# Patient Record
Sex: Female | Born: 1950
Health system: Southern US, Community
[De-identification: ages and names within clinical notes are randomized; demographics above are authoritative.]

## PROBLEM LIST (undated history)

## (undated) DIAGNOSIS — B019 Varicella without complication: Secondary | ICD-10-CM

## (undated) DIAGNOSIS — G43909 Migraine, unspecified, not intractable, without status migrainosus: Secondary | ICD-10-CM

## (undated) HISTORY — PX: FINGER SURGERY: SHX640

## (undated) HISTORY — DX: Varicella without complication: B01.9

## (undated) HISTORY — PX: BREAST BIOPSY: SHX20

## (undated) HISTORY — DX: Migraine, unspecified, not intractable, without status migrainosus: G43.909

---

## 1989-08-08 HISTORY — PX: BREAST EXCISIONAL BIOPSY: SUR124

## 1998-01-13 ENCOUNTER — Ambulatory Visit (HOSPITAL_COMMUNITY): Admission: RE | Admit: 1998-01-13 | Discharge: 1998-01-13 | Payer: Self-pay | Admitting: Obstetrics & Gynecology

## 1998-09-16 ENCOUNTER — Other Ambulatory Visit: Admission: RE | Admit: 1998-09-16 | Discharge: 1998-09-16 | Payer: Self-pay | Admitting: Obstetrics & Gynecology

## 1998-12-14 ENCOUNTER — Other Ambulatory Visit: Admission: RE | Admit: 1998-12-14 | Discharge: 1998-12-14 | Payer: Self-pay | Admitting: Obstetrics & Gynecology

## 1999-02-16 ENCOUNTER — Encounter: Payer: Self-pay | Admitting: Obstetrics & Gynecology

## 1999-02-16 ENCOUNTER — Ambulatory Visit (HOSPITAL_COMMUNITY): Admission: RE | Admit: 1999-02-16 | Discharge: 1999-02-16 | Payer: Self-pay | Admitting: Obstetrics & Gynecology

## 1999-09-20 ENCOUNTER — Other Ambulatory Visit: Admission: RE | Admit: 1999-09-20 | Discharge: 1999-09-20 | Payer: Self-pay | Admitting: Obstetrics & Gynecology

## 2000-02-22 ENCOUNTER — Ambulatory Visit (HOSPITAL_COMMUNITY): Admission: RE | Admit: 2000-02-22 | Discharge: 2000-02-22 | Payer: Self-pay | Admitting: Obstetrics & Gynecology

## 2000-02-22 ENCOUNTER — Encounter: Payer: Self-pay | Admitting: Obstetrics & Gynecology

## 2000-10-23 ENCOUNTER — Other Ambulatory Visit: Admission: RE | Admit: 2000-10-23 | Discharge: 2000-10-23 | Payer: Self-pay | Admitting: Obstetrics & Gynecology

## 2001-05-02 ENCOUNTER — Ambulatory Visit (HOSPITAL_COMMUNITY): Admission: RE | Admit: 2001-05-02 | Discharge: 2001-05-02 | Payer: Self-pay | Admitting: Obstetrics & Gynecology

## 2001-05-02 ENCOUNTER — Encounter: Payer: Self-pay | Admitting: Obstetrics & Gynecology

## 2001-06-13 ENCOUNTER — Encounter: Payer: Self-pay | Admitting: Internal Medicine

## 2001-06-20 ENCOUNTER — Encounter: Payer: Self-pay | Admitting: Internal Medicine

## 2001-10-31 ENCOUNTER — Other Ambulatory Visit: Admission: RE | Admit: 2001-10-31 | Discharge: 2001-10-31 | Payer: Self-pay | Admitting: Obstetrics & Gynecology

## 2002-05-09 ENCOUNTER — Encounter: Payer: Self-pay | Admitting: Obstetrics & Gynecology

## 2002-05-09 ENCOUNTER — Ambulatory Visit (HOSPITAL_COMMUNITY): Admission: RE | Admit: 2002-05-09 | Discharge: 2002-05-09 | Payer: Self-pay | Admitting: Obstetrics & Gynecology

## 2002-11-06 ENCOUNTER — Other Ambulatory Visit: Admission: RE | Admit: 2002-11-06 | Discharge: 2002-11-06 | Payer: Self-pay | Admitting: Obstetrics & Gynecology

## 2003-06-02 ENCOUNTER — Encounter: Payer: Self-pay | Admitting: Obstetrics & Gynecology

## 2003-06-02 ENCOUNTER — Ambulatory Visit (HOSPITAL_COMMUNITY): Admission: RE | Admit: 2003-06-02 | Discharge: 2003-06-02 | Payer: Self-pay | Admitting: Obstetrics & Gynecology

## 2003-11-26 ENCOUNTER — Other Ambulatory Visit: Admission: RE | Admit: 2003-11-26 | Discharge: 2003-11-26 | Payer: Self-pay | Admitting: Obstetrics & Gynecology

## 2004-06-09 ENCOUNTER — Ambulatory Visit (HOSPITAL_COMMUNITY): Admission: RE | Admit: 2004-06-09 | Discharge: 2004-06-09 | Payer: Self-pay | Admitting: Obstetrics & Gynecology

## 2004-12-06 ENCOUNTER — Other Ambulatory Visit: Admission: RE | Admit: 2004-12-06 | Discharge: 2004-12-06 | Payer: Self-pay | Admitting: Obstetrics & Gynecology

## 2005-07-07 ENCOUNTER — Ambulatory Visit (HOSPITAL_COMMUNITY): Admission: RE | Admit: 2005-07-07 | Discharge: 2005-07-07 | Payer: Self-pay | Admitting: Obstetrics & Gynecology

## 2006-07-11 ENCOUNTER — Ambulatory Visit (HOSPITAL_COMMUNITY): Admission: RE | Admit: 2006-07-11 | Discharge: 2006-07-11 | Payer: Self-pay | Admitting: Obstetrics & Gynecology

## 2006-07-19 ENCOUNTER — Encounter: Admission: RE | Admit: 2006-07-19 | Discharge: 2006-07-19 | Payer: Self-pay | Admitting: Obstetrics & Gynecology

## 2007-08-07 ENCOUNTER — Ambulatory Visit (HOSPITAL_COMMUNITY): Admission: RE | Admit: 2007-08-07 | Discharge: 2007-08-07 | Payer: Self-pay | Admitting: Obstetrics & Gynecology

## 2008-08-13 ENCOUNTER — Ambulatory Visit (HOSPITAL_COMMUNITY): Admission: RE | Admit: 2008-08-13 | Discharge: 2008-08-13 | Payer: Self-pay | Admitting: Obstetrics & Gynecology

## 2008-08-19 ENCOUNTER — Encounter: Admission: RE | Admit: 2008-08-19 | Discharge: 2008-08-19 | Payer: Self-pay | Admitting: Obstetrics & Gynecology

## 2009-08-20 ENCOUNTER — Ambulatory Visit (HOSPITAL_COMMUNITY): Admission: RE | Admit: 2009-08-20 | Discharge: 2009-08-20 | Payer: Self-pay | Admitting: Obstetrics & Gynecology

## 2010-06-08 ENCOUNTER — Encounter (INDEPENDENT_AMBULATORY_CARE_PROVIDER_SITE_OTHER): Payer: Self-pay | Admitting: *Deleted

## 2010-06-14 ENCOUNTER — Encounter: Admission: RE | Admit: 2010-06-14 | Discharge: 2010-06-14 | Payer: Self-pay | Admitting: Family Medicine

## 2010-07-12 ENCOUNTER — Telehealth: Payer: Self-pay | Admitting: Internal Medicine

## 2010-07-13 DIAGNOSIS — R197 Diarrhea, unspecified: Secondary | ICD-10-CM | POA: Insufficient documentation

## 2010-07-13 DIAGNOSIS — E049 Nontoxic goiter, unspecified: Secondary | ICD-10-CM | POA: Insufficient documentation

## 2010-07-13 DIAGNOSIS — K589 Irritable bowel syndrome without diarrhea: Secondary | ICD-10-CM | POA: Insufficient documentation

## 2010-07-19 ENCOUNTER — Ambulatory Visit: Payer: Self-pay | Admitting: Internal Medicine

## 2010-07-19 ENCOUNTER — Encounter: Payer: Self-pay | Admitting: Internal Medicine

## 2010-07-19 DIAGNOSIS — R1013 Epigastric pain: Secondary | ICD-10-CM

## 2010-07-19 DIAGNOSIS — K3189 Other diseases of stomach and duodenum: Secondary | ICD-10-CM | POA: Insufficient documentation

## 2010-07-20 ENCOUNTER — Telehealth (INDEPENDENT_AMBULATORY_CARE_PROVIDER_SITE_OTHER): Payer: Self-pay | Admitting: *Deleted

## 2010-07-20 LAB — CONVERTED CEMR LAB
Albumin: 3.7 g/dL (ref 3.5–5.2)
Alkaline Phosphatase: 71 units/L (ref 39–117)
IgA: 139 mg/dL (ref 68–378)
Lipase: 32 units/L (ref 11.0–59.0)
Sed Rate: 13 mm/hr (ref 0–22)
Total Protein: 6.6 g/dL (ref 6.0–8.3)

## 2010-07-21 ENCOUNTER — Telehealth: Payer: Self-pay | Admitting: Internal Medicine

## 2010-08-25 ENCOUNTER — Ambulatory Visit (HOSPITAL_COMMUNITY)
Admission: RE | Admit: 2010-08-25 | Discharge: 2010-08-25 | Payer: Self-pay | Source: Home / Self Care | Attending: Obstetrics & Gynecology | Admitting: Obstetrics & Gynecology

## 2010-08-29 ENCOUNTER — Encounter: Payer: Self-pay | Admitting: Internal Medicine

## 2010-08-29 ENCOUNTER — Encounter: Payer: Self-pay | Admitting: Obstetrics & Gynecology

## 2010-09-08 ENCOUNTER — Encounter: Payer: Self-pay | Admitting: Internal Medicine

## 2010-09-09 NOTE — Progress Notes (Signed)
  Phone Note Call from Patient Call back at Home Phone [phone]   Caller: Patient Call For: nurse Summary of Call: Patient is concerned because she is scheduled for a HIDA scan on 07/26/10 and  she remembers Dr. Juanda Chance saying she had a recent ultrasound of abdomen/gallbladder. Patient states she has not had a recent ultrasound of the abdomen. She wants to be sure she doesn't need to have an ultrasound prior to the HIDA scan. Please, advise. Initial call taken by: Jesse Fall RN,  July 20, 2010 4:46 PM  Follow-up for Phone Call        I have checked the chart and she had a abd. ultrasound in 2002, , so she ought to have  an abdominal ultrasound before the HIDA scan. Sorry, I thought I saw in her outside records where she had a sono recently. can You, please, schedule abd. sono before her HIDA? Thank You Follow-up by: Hart Carwin MD,  July 20, 2010 10:31 PM  Additional Follow-up for Phone Call Additional follow up Details #1::        Scheduled patient for Abdominal ultrasound on 07/22/10 at [facility] radiology(Jennifer). Patient to be NPO 4 hours prior to ultrasound.Message left for patient to call back at her home number. Jesse Fall RN  July 21, 2010 8:42 AM Patient given above information.Patient was worried about precert/insurance with the ultrasound. Morrie Sheldon given order to precert and she states she will call patient with information as soon as she has it. Patient also wanted to cancel HIDA scan but did agree it leaving it on until ultrasound results are back. Additional Follow-up by: Jesse Fall RN,  July 21, 2010 10:55 AM

## 2010-09-09 NOTE — Assessment & Plan Note (Signed)
Summary: chronic diarrhea 2 PM appointment/[facility]   History of Present Illness Visit Type: Initial Visit Primary GI MD: Lina Sar MD Primary Provider: Porfirio Oar, PA Chief Complaint: diarrhea, that subsuded early Nov., weight loss & nausea History of Present Illness:   This is a 60 year old white female who has had 3 separate episodes of nausea followed by diarrhea and indigestion lasting several days to 2 weeks. Her first episode occurred in August 2011. The second episode in September and a third episode in October. This was evaluated at [facility]. She had an upper abdominal ultrasound which was normal. She denies symptoms of gastroesophageal reflux. During the episodes, patient is unable to eat and consequently has lost weight. She is down to 100 pounds from an initial 108 pounds. She has no prior GI history. There is no family history of inflammatory bowel disease but there is a strong history of gallbladder disease in both parents. Her father had a cholecystectomy when he was about 82 years old and her mother had a cholecystectomy at age 42.   GI Review of Systems    Reports bloating, nausea, vomiting, and  weight loss.      Denies abdominal pain, acid reflux, belching, chest pain, dysphagia with liquids, dysphagia with solids, heartburn, loss of appetite, vomiting blood, and  weight gain.      Reports constipation and  diarrhea.     Denies anal fissure, black tarry stools, change in bowel habit, diverticulosis, fecal incontinence, heme positive stool, hemorrhoids, irritable bowel syndrome, jaundice, light color stool, liver problems, rectal bleeding, and  rectal pain. Preventive Screening-Counseling & Management  Alcohol-Tobacco     Smoking Status: never    Current Medications (verified): 1)  Terbinafine Hcl 250 Mg Tabs (Terbinafine Hcl) .... Once Daily 2)  Keppra 250 Mg Tabs (Levetiracetam) .... Take 1 At Bedtime 3)  Imitrex 100 Mg Tabs (Sumatriptan  Succinate) .... As Needed 4)  Zomig 5 Mg Tabs (Zolmitriptan) .... As Needed 5)  Naproxen Sodium 550 Mg Tabs (Naproxen Sodium) .... As Needed  Allergies (verified): 1)  ! Pcn  Past History:  Past Medical History: Chronic Headache ITABLE BOWEL SYNDROME (ICD-564.1) THYROMEGALY (ICD-240.9) DIARRHEA, CHRONIC (ICD-787.91)   Chronic Headaches Depression  Past Surgical History: Reviewed history from 07/13/2010 and no changes required. Breast Biopsy Toenail Removal  Family History: Gallbladder Disease-Mother, Father No FH of Colon Cancer: Family History of Breast Cancer: Family History of Colon Cancer:Uncle Family History of Colon Polyps:  Social History: Alcohol Use - no Illicit Drug Use - no Occupation: Psychologist, sport and exercise Patient has never smoked.  Smoking Status:  never  Review of Systems       The patient complains of headaches-new and sleeping problems.  The patient denies allergy/sinus, anemia, anxiety-new, arthritis/joint pain, back pain, blood in urine, breast changes/lumps, change in vision, confusion, cough, coughing up blood, depression-new, fainting, fatigue, fever, hearing problems, heart murmur, heart rhythm changes, itching, menstrual pain, muscle pains/cramps, night sweats, nosebleeds, pregnancy symptoms, shortness of breath, skin rash, sore throat, swelling of feet/legs, swollen lymph glands, thirst - excessive , urination - excessive , urination changes/pain, urine leakage, vision changes, and voice change.         Pertinent positive and negative review of systems were noted in the above HPI. All other ROS was otherwise negative.   Vital Signs:  Patient profile:   60 year old female Height:      63 inches Weight:      100 pounds BMI:  17.78 Pulse rate:   64 / minute Pulse rhythm:   regular BP sitting:   100 / 62  (left arm) Cuff size:   regular  Vitals Entered By: June McMurray CMA Duncan Dull) (July 19, 2010 2:27 PM)  Physical Exam  General:  Alert,  oriented and cooperative. Eyes:  Nonicteric. Mouth:  Normal mucosa. Neck:  Supple; no masses or thyromegaly. Lungs:  Clear throughout to auscultation. Heart:  Regular rate and rhythm; no murmurs, rubs,  or bruits. Abdomen:  soft relaxed abdomen with normoactive bowel sounds. Tender on inspiration along the right costal margin. Area above the gallbladder is tender. Right lower quadrant unremarkable without mass. Left lower and upper quadrants are normal. Rectal:  normal with soft Hemoccult-negative stool in large amount. Extremities:  No clubbing, cyanosis, edema or deformities noted. Skin:  Intact without significant lesions or rashes. Psych:  Alert and cooperative. Normal mood and affect.   Impression & Recommendations:  Problem # 1:  DIARRHEA, CHRONIC (ICD-787.91) Patient has had several episode of diarrhea and dyspepsia. She has had long-standing food intolerance and has a strong family history of gallbladder disease. I suspect she has biliary dysfunction. We will obtain liver function tests, amylase and lipase. We will schedule her for a HIDA scan with CCK. Orders: TLB-Hepatic/Liver Function Pnl (80076-HEPATIC) TLB-Amylase (82150-AMYL) TLB-Lipase (83690-LIPASE) TLB-Sedimentation Rate (ESR) (85652-ESR) TLB-IgA (Immunoglobulin A) (82784-IGA) T-Tissue Transglutamase Ab IgA (16109-60454) HIDA CCK (HIDA CCK)  Problem # 2:  IRRITABLE BOWEL SYNDROME (ICD-564.1) Patient has constipation predominant IBS with episodes of acute diarrhea. She will need a colonoscopy. But, before that, we will go ahead and evaluate her for gallbladder disease. We will check tissue transglutaminase levels, sedimentation rate and IgA levels. She is to follow a low-fat diet.  Problem # 3:  DYSPEPSIA (ICD-536.8)  Patient will need an upper endoscopy if HIDA scan negative. She could,  at that time have screening colonoscopy  Orders: HIDA CCK (HIDA CCK)  Patient Instructions: 1)  Your physician requests that  you go to the basement floor of our office to have the following labwork completed before leaving today: LFT's, amylase, lipase, TtG, IgA, Sedimentation rate. 2)  You have been scheduled for a HIDA scan with [facility] @ [facility] on 07/26/10 @ 1:00 pm. 3)  continue low-fat diet 4)  If HIDA scan negative consider upper endoscopy and colonoscopy 5)  Copy sent to : Porfirio Oar, PA 6)  The medication list was reviewed and reconciled.  All changed / newly prescribed medications were explained.  A complete medication list was provided to the patient / caregiver.

## 2010-09-09 NOTE — Assessment & Plan Note (Signed)
Summary: Gastroenterology    [facility] PATIENT GASTROENTEROLOGY ADMISSION NOTE   NAME:  Patricia Chapman, Patricia Chapman  DATE:  06/13/2001       OFFICE NO:  [phone]  The patient is a very nice 60 year old black female referred by Dr. Jennette Kettle for evaluation of bloating and tenderness under the ribs. She dates the onset of these symptoms several months ago, having intermittent diarrhea and constipation. Despite of having good eating habits, she has progressed to food intolerance to where she prefers to eat at home. She has been concerned about symptoms of ovarian cancer being bloating, and she wanted to be checked out. Her weight has been stable. She denies any rectal bleeding. There is no family history of colon cancer, but there is strong family history of gallbladder disease in both her mother and father. She takes aspirin on an as-needed basis, does not drink alcohol. She also takes Aleve on an as-needed basis. She denies symptoms of gastroesophageal reflux.  PHYSICAL EXAMINATION: Blood pressure is 118/60. The pulse is 78. The weight is 98 pounds. She was thin, in no distress. Skin was warm and vascular. Sclerae nonicteric. Neck was supple with no adenopathy. The lungs were clear to auscultation. COR: Normal S1, normal S2. The abdominal exam showed thin, relaxed abdomen. Mild tenderness in epigastric and subxiphoid area in the midline. Gallbladder area and right upper quadrant were unremarkable. There was no costovertebral angle tenderness. Lower abdomen showed palpable colon but no mass or stool. Rectal exam showed normal rectal tone with hemoccult-negative stool.  IMPRESSION 57.  A 60 year old female with nonspecific bloating and food intolerance with positive family history of gallbladder disease, rule out biliary dysfunction. 2.  A positive family history of colon polyps but no family history of colon cancer. Suspect irritable bowel syndrome. Doubt inflammatory bowel disease.  PLAN 1.  Ultrasound  of the gallbladder and upper abdomen. 2.  Citrucel 1 pack a day. 3.  Increase fiber. Fiber diet given to the patient. 4.  Discuss colonoscopy with the patient. She has [employer] who does not cover screening colonoscopy. The patient is going to wait to see whether her symptoms subside before scheduling a colonoscopy.     Hedwig Morton. Juanda Chance, M.D.  [id number] cc:     Dr. Varney Baas D:   06/13/2001; T: ; Job [id number]

## 2010-09-09 NOTE — Progress Notes (Signed)
Summary: Triage  Phone Note Call from Patient Call back at Home Phone [phone]   Caller: Patient Call For: Dr. Juanda Chance Reason for Call: Talk to Nurse Summary of Call: Pt was having chronic diarrhea, was put on jacobs schedule and is a brodie pt, wants todiscuss diarrhea and colon with brodie but no available appts?!?! wants to see someone Initial call taken by: [place] Johnson,  July 12, 2010 3:30 PM  Follow-up for Phone Call         Patient added to Dr. Regino Schultze schedule on 07/14/10 at 9 AM. Message left for patient to call back.Jesse Fall RN  July 12, 2010 3:45 Message left for patient to call back.Jesse Fall RN  July 13, 2010 8:08 AM Spoke with patient. She is having a mirgaine and wants to r/s for 07/19/10 at 2:00 PM. Patient rescheduled. Follow-up by: Jesse Fall RN,  July 13, 2010 10:04 AM

## 2010-09-09 NOTE — Progress Notes (Signed)
Summary: Questions  Phone Note Call from Patient Call back at Work Phone [phone]   Caller: Patient Call For: Dr. Juanda Chance Reason for Call: Talk to Nurse Summary of Call: Pt wants to speak with Rene Kocher again following up from this morning Initial call taken by: [place] Johnson,  July 21, 2010 4:04 PM  Follow-up for Phone Call        Patient has decided she wants to wait until the first of the year to get the ultrasound and HIDA scan. Patient given the number to call radiology to reschedule these appointments to a more suitable time for her. Follow-up by: Jesse Fall RN,  July 21, 2010 4:38 PM  Additional Follow-up for Phone Call Additional follow up Details #1::        OK Additional Follow-up by: Hart Carwin MD,  July 21, 2010 11:24 PM     Appended Document: Questions I have received a message from patient (I called her to discuss whether she planned on going through with HIDA and u/s). Patient states that she is actually doing much better and would like to hold off on the tests until she has more symptoms. At this time, all tests have been cancelled.

## 2010-09-09 NOTE — Letter (Signed)
Summary: New Patient letter  [facility]  [address] [place], [place] [address]   Phone: [phone]  Fax: [phone]       06/08/2010 MRN: [id number]  Patricia Chapman [address] [place], [place]  [address]  Dear Patricia Chapman,  Welcome to the Gastroenterology Division at [facility]    You are scheduled to see Dr.  Christella Chapman on 07-16-10 at 9:15a.m. on the 3rd floor at [facility], [address]  We ask that you try to arrive at our office 15 minutes prior to your appointment time to allow for check-in.  We would like you to complete the enclosed self-administered evaluation form prior to your visit and bring it with you on the day of your appointment.  We will review it with you.  Also, please bring a complete list of all your medications or, if you prefer, bring the medication bottles and we will list them.  Please bring your insurance card so that we may make a copy of it.  If your insurance requires a referral to see a specialist, please bring your referral form from your primary care physician.  Co-payments are due at the time of your visit and may be paid by cash, check or credit card.     Your office visit will consist of a consult with your physician (includes a physical exam), any laboratory testing he/she may order, scheduling of any necessary diagnostic testing (e.g. x-ray, ultrasound, CT-scan), and scheduling of a procedure (e.g. Endoscopy, Colonoscopy) if required.  Please allow enough time on your schedule to allow for any/all of these possibilities.    If you cannot keep your appointment, please call [phone] to cancel or reschedule prior to your appointment date.  This allows [place] the opportunity to schedule an appointment for another patient in need of care.  If you do not cancel or reschedule by 5 p.m. the business day prior to your appointment date, you will be charged a $50.00 late cancellation/no-show fee.    Thank you for choosing [facility] for your medical needs.  We appreciate the opportunity to care for you.  Please visit [place] at our website  to learn more about our practice.                     Sincerely,                                                             The Gastroenterology Division

## 2011-07-18 ENCOUNTER — Other Ambulatory Visit (HOSPITAL_COMMUNITY): Payer: Self-pay | Admitting: Obstetrics & Gynecology

## 2011-07-18 DIAGNOSIS — Z1231 Encounter for screening mammogram for malignant neoplasm of breast: Secondary | ICD-10-CM

## 2011-08-29 ENCOUNTER — Ambulatory Visit (HOSPITAL_COMMUNITY)
Admission: RE | Admit: 2011-08-29 | Discharge: 2011-08-29 | Disposition: A | Discharge status: Home / Self Care | Payer: BC Managed Care – PPO | Source: Ambulatory Visit | Attending: Obstetrics & Gynecology | Admitting: Obstetrics & Gynecology

## 2011-08-29 DIAGNOSIS — Z1231 Encounter for screening mammogram for malignant neoplasm of breast: Secondary | ICD-10-CM | POA: Insufficient documentation

## 2011-10-20 ENCOUNTER — Encounter: Payer: Self-pay | Admitting: Physician Assistant

## 2011-10-20 ENCOUNTER — Ambulatory Visit (INDEPENDENT_AMBULATORY_CARE_PROVIDER_SITE_OTHER): Payer: BC Managed Care – PPO | Admitting: Family Medicine

## 2011-10-20 VITALS — BP 102/60 | HR 65 | Temp 97.8°F | Resp 16 | Ht 64.5 in | Wt 100.8 lb

## 2011-10-20 DIAGNOSIS — R209 Unspecified disturbances of skin sensation: Secondary | ICD-10-CM

## 2011-10-20 DIAGNOSIS — R202 Paresthesia of skin: Secondary | ICD-10-CM

## 2011-10-20 NOTE — Patient Instructions (Signed)
I'll let you know when I receive your lab results.  Please call if you haven't heard in 10-14 days.

## 2011-10-20 NOTE — Progress Notes (Signed)
  Subjective:    Patient ID: Patricia Chapman, female    DOB: March 11, 1951, 61 y.o.   MRN: [id number]  HPI Tingling feeling, best felt when barefoot, not painful, less noticeable during the day when she's working. Less noticeable when non-weight bearing, but never resolves.  No trauma or injury.  No low back pain.  No discomfort in the proximal foot or leg.   Review of Systems     Objective:   Physical Exam  Strong pedal pulses.  Capillary refill <2 seconds.  No erythema, edema, no thick callous noted.  No deformity of the foot or toes.  Medial aspect of left great toenail is deformed from previous nail wedge excision.  Full range of motion of the ankle, foot and toes. Palpation of the plantar aspect of the distal left foot and toes increases the tingling sensation.  Point of maximum tingling is between the first and second metatarsal heads, and a Morton's neuroma is considered, but there is no pain.  CBC, Vitamin B12, TSH, Vitamin D are pending.    Assessment & Plan:  Paresthesias, foot.  Await lab results.   She will contact her podiatrist for an appointment.  Discussed with Dr. Audria Nine.

## 2011-10-21 ENCOUNTER — Encounter: Payer: Self-pay | Admitting: Physician Assistant

## 2011-10-21 LAB — CBC WITH DIFFERENTIAL/PLATELET
Basophils Absolute: 0 10*3/uL (ref 0.0–0.1)
Basophils Relative: 1 % (ref 0–1)
Eosinophils Absolute: 0.1 10*3/uL (ref 0.0–0.7)
Eosinophils Relative: 2 % (ref 0–5)
Lymphs Abs: 1.6 10*3/uL (ref 0.7–4.0)
MCH: 32.1 pg (ref 26.0–34.0)
MCHC: 33 g/dL (ref 30.0–36.0)
MCV: 97.2 fL (ref 78.0–100.0)
Neutrophils Relative %: 46 % (ref 43–77)
Platelets: 167 10*3/uL (ref 150–400)
RBC: 4.3 MIL/uL (ref 3.87–5.11)
RDW: 13.3 % (ref 11.5–15.5)

## 2011-10-21 LAB — VITAMIN D 25 HYDROXY (VIT D DEFICIENCY, FRACTURES): Vit D, 25-Hydroxy: 35 ng/mL (ref 30–89)

## 2011-11-15 ENCOUNTER — Telehealth: Payer: Self-pay

## 2011-11-15 NOTE — Telephone Encounter (Signed)
LMOM to CB. 

## 2011-11-15 NOTE — Telephone Encounter (Signed)
Please call the patient for additional details regarding her questions.

## 2011-11-15 NOTE — Telephone Encounter (Signed)
FOR CHELLE: PT WOULD LIKE TO SPEAK WITH YOU REGARDING HER DIAGNOSIS PLEASE CALL [phone]

## 2011-11-19 NOTE — Telephone Encounter (Signed)
PT WOULD LIKE THE RESULT OF RESULTS FROM LAST VISIT

## 2011-11-21 NOTE — Telephone Encounter (Signed)
I sent her a letter with a copy of her labs on 10/21/2011.  Did she not receive it?  It can be reprinted, or simply discussed with her, whichever she prefers.

## 2011-11-21 NOTE — Telephone Encounter (Signed)
LMOM with normal results and that we had mailed her a copy on 3/15, but I am re-mailing her the letter w/results.

## 2011-11-21 NOTE — Telephone Encounter (Signed)
Chelle, please address

## 2012-02-16 ENCOUNTER — Other Ambulatory Visit: Payer: Self-pay | Admitting: Obstetrics & Gynecology

## 2012-02-16 DIAGNOSIS — N6310 Unspecified lump in the right breast, unspecified quadrant: Secondary | ICD-10-CM

## 2012-02-22 ENCOUNTER — Other Ambulatory Visit: Payer: BC Managed Care – PPO

## 2012-02-22 ENCOUNTER — Ambulatory Visit
Admission: RE | Admit: 2012-02-22 | Discharge: 2012-02-22 | Disposition: A | Discharge status: Home / Self Care | Payer: BC Managed Care – PPO | Source: Ambulatory Visit | Attending: Obstetrics & Gynecology | Admitting: Obstetrics & Gynecology

## 2012-02-22 DIAGNOSIS — N6310 Unspecified lump in the right breast, unspecified quadrant: Secondary | ICD-10-CM

## 2012-08-15 ENCOUNTER — Other Ambulatory Visit (HOSPITAL_COMMUNITY): Payer: Self-pay | Admitting: Obstetrics & Gynecology

## 2012-08-15 DIAGNOSIS — Z1231 Encounter for screening mammogram for malignant neoplasm of breast: Secondary | ICD-10-CM

## 2012-08-30 ENCOUNTER — Other Ambulatory Visit: Payer: Self-pay | Admitting: Obstetrics & Gynecology

## 2012-08-30 ENCOUNTER — Ambulatory Visit (HOSPITAL_COMMUNITY)
Admission: RE | Admit: 2012-08-30 | Discharge: 2012-08-30 | Disposition: A | Discharge status: Home / Self Care | Payer: BC Managed Care – PPO | Source: Ambulatory Visit | Attending: Obstetrics & Gynecology | Admitting: Obstetrics & Gynecology

## 2012-08-30 DIAGNOSIS — N6009 Solitary cyst of unspecified breast: Secondary | ICD-10-CM

## 2012-08-30 DIAGNOSIS — Z1231 Encounter for screening mammogram for malignant neoplasm of breast: Secondary | ICD-10-CM

## 2012-09-12 ENCOUNTER — Ambulatory Visit
Admission: RE | Admit: 2012-09-12 | Discharge: 2012-09-12 | Disposition: A | Discharge status: Home / Self Care | Payer: BC Managed Care – PPO | Source: Ambulatory Visit | Attending: Obstetrics & Gynecology | Admitting: Obstetrics & Gynecology

## 2012-09-12 DIAGNOSIS — N6009 Solitary cyst of unspecified breast: Secondary | ICD-10-CM

## 2013-02-25 LAB — HM PAP SMEAR: HM Pap smear: NORMAL

## 2013-04-17 ENCOUNTER — Ambulatory Visit (INDEPENDENT_AMBULATORY_CARE_PROVIDER_SITE_OTHER): Payer: BC Managed Care – PPO | Admitting: Family Medicine

## 2013-04-17 ENCOUNTER — Encounter: Payer: Self-pay | Admitting: Family Medicine

## 2013-04-17 VITALS — BP 106/62 | HR 78 | Temp 97.9°F | Ht 63.0 in | Wt 97.0 lb

## 2013-04-17 DIAGNOSIS — R6881 Early satiety: Secondary | ICD-10-CM | POA: Insufficient documentation

## 2013-04-17 DIAGNOSIS — M19042 Primary osteoarthritis, left hand: Secondary | ICD-10-CM

## 2013-04-17 DIAGNOSIS — Z23 Encounter for immunization: Secondary | ICD-10-CM

## 2013-04-17 DIAGNOSIS — M19049 Primary osteoarthritis, unspecified hand: Secondary | ICD-10-CM | POA: Insufficient documentation

## 2013-04-17 DIAGNOSIS — R634 Abnormal weight loss: Secondary | ICD-10-CM | POA: Insufficient documentation

## 2013-04-17 DIAGNOSIS — L989 Disorder of the skin and subcutaneous tissue, unspecified: Secondary | ICD-10-CM | POA: Insufficient documentation

## 2013-04-17 DIAGNOSIS — Z13220 Encounter for screening for lipoid disorders: Secondary | ICD-10-CM

## 2013-04-17 DIAGNOSIS — Z1331 Encounter for screening for depression: Secondary | ICD-10-CM

## 2013-04-17 LAB — BASIC METABOLIC PANEL
Calcium: 8.8 mg/dL (ref 8.4–10.5)
GFR: 99.64 mL/min (ref >60.00)
Glucose, Bld: 79 mg/dL (ref 70–99)
Potassium: 3.6 mEq/L (ref 3.5–5.1)
Sodium: 141 mEq/L (ref 135–145)

## 2013-04-17 LAB — CBC WITH DIFFERENTIAL/PLATELET
Eosinophils Absolute: 0.1 10*3/uL (ref 0.0–0.7)
Eosinophils Relative: 1.4 % (ref 0.0–5.0)
HCT: 37.6 % (ref 36.0–46.0)
Lymphs Abs: 2.1 10*3/uL (ref 0.7–4.0)
MCHC: 33.8 g/dL (ref 30.0–36.0)
MCV: 93.9 fl (ref 78.0–100.0)
Monocytes Absolute: 0.4 10*3/uL (ref 0.1–1.0)
Neutrophils Relative %: 40 % — ABNORMAL LOW (ref 43.0–77.0)
Platelets: 147 10*3/uL — ABNORMAL LOW (ref 150.0–400.0)
RDW: 13.4 % (ref 11.5–14.6)
WBC: 4.4 10*3/uL — ABNORMAL LOW (ref 4.5–10.5)

## 2013-04-17 LAB — HEPATIC FUNCTION PANEL
Albumin: 3.5 g/dL (ref 3.5–5.2)
Alkaline Phosphatase: 39 U/L (ref 39–117)
Total Bilirubin: 0.4 mg/dL (ref 0.3–1.2)

## 2013-04-17 LAB — LIPID PANEL
HDL: 56.1 mg/dL (ref >39.00)
Triglycerides: 120 mg/dL (ref 0.0–149.0)
VLDL: 24 mg/dL (ref 0.0–40.0)

## 2013-04-17 LAB — TSH: TSH: 0.82 u[IU]/mL (ref 0.35–5.50)

## 2013-04-17 NOTE — Progress Notes (Signed)
  Subjective:    Patient ID: Patricia Chapman, female    DOB: 1951/05/29, 62 y.o.   MRN: [id number]  HPI New to establish.  GYN- Jennette Kettle, Neuro- [place], [place].  No previous PCP.  L index finger- pt reports pain at DIP.  R hand dominant.  No known injury.  Pain started at least 1 yr ago but has been worsening.  Now keeping her awake at night.  Some enlargement of knuckles.  No redness or swelling.  No family hx of RA.  Weight loss- pt reports she is losing 'a small amount of weight each year'.  Pt's mother is concerned about ovarian cancer.  Denies abd pain.  Some early satiety sxs.  Eating regularly.  Does eat meat.  Eats breakfast daily.  No family hx of ovarian cancer.  No N/V.  No night sweats.  No cough.  R forearm sore- present for 'most of the summer', tender to touch.  Raw appearing.  ~1 cm.  Would like flu shot today  Review of Systems For ROS see HPI     Objective:   Physical Exam  Vitals reviewed. Constitutional: She is oriented to person, place, and time. She appears well-developed and well-nourished. No distress.  HENT:  Head: Normocephalic and atraumatic.  Mouth/Throat: Uvula is midline, oropharynx is clear and moist and mucous membranes are normal.  Eyes: Conjunctivae and EOM are normal. Pupils are equal, round, and reactive to light.  Neck: Normal range of motion. Neck supple. No thyromegaly present.  Cardiovascular: Normal rate, regular rhythm, normal heart sounds and intact distal pulses.   No murmur heard. Pulmonary/Chest: Effort normal and breath sounds normal. No respiratory distress. She has no wheezes.  Abdominal: Soft. Bowel sounds are normal. She exhibits no distension and no mass. There is no tenderness. There is no rebound and no guarding.  Musculoskeletal:  Degenerative changes of L index DIP joint Slight ulnar deviation of wrists and fingers  Lymphadenopathy:    She has no cervical adenopathy.  Neurological: She is alert and oriented to person, place, and  time.  Skin: Skin is warm and dry.  1 cm raw area on R forearm possibly non-melanoma skin cancer  Psychiatric: She has a normal mood and affect. Her behavior is normal.          Assessment & Plan:

## 2013-04-17 NOTE — Assessment & Plan Note (Signed)
New.  Check RF to r/o autoimmune process.  Tylenol arthritis prn.  Reviewed supportive care and red flags that should prompt return.  Pt expressed understanding and is in agreement w/ plan.

## 2013-04-17 NOTE — Patient Instructions (Addendum)
Follow up in 3 months to recheck weight loss We'll notify you of your lab results and make any changes if needed Call and check with your insurance company about the shingles shot Tylenol as needed for finger pain We'll call you with your dermatology appt Call with any questions or concerns Welcome!  We're glad to have you!

## 2013-04-17 NOTE — Assessment & Plan Note (Signed)
New.  Concerning for non-melanoma skin cancer.  Refer to derm for possible excision.

## 2013-04-17 NOTE — Assessment & Plan Note (Signed)
New.  Pt is very small framed and only 97 lbs.  Does not look emaciated or malnourished but pt made at least 2 comments referencing her 'big fat belly' and her 'muffin top'.  Concerned about eating disorder.  Discussed body dysmorphia.  Check labs to r/o underlying process.  Encouraged regular eating.  Will follow closely.

## 2013-04-17 NOTE — Assessment & Plan Note (Signed)
New.  Pt concerned about ovarian cancer.  Check CA125.  I'm more concerned that pt has been restricting and this has caused her stomach to shrink and be less accommodating.  Will follow.

## 2013-04-18 ENCOUNTER — Encounter: Payer: Self-pay | Admitting: General Practice

## 2013-04-24 ENCOUNTER — Encounter: Payer: Self-pay | Admitting: Podiatry

## 2013-04-24 ENCOUNTER — Ambulatory Visit (INDEPENDENT_AMBULATORY_CARE_PROVIDER_SITE_OTHER): Payer: BC Managed Care – PPO | Admitting: Podiatry

## 2013-04-24 VITALS — BP 102/54 | HR 78 | Ht 63.0 in | Wt 97.0 lb

## 2013-04-24 DIAGNOSIS — M21969 Unspecified acquired deformity of unspecified lower leg: Secondary | ICD-10-CM | POA: Insufficient documentation

## 2013-04-24 DIAGNOSIS — B351 Tinea unguium: Secondary | ICD-10-CM | POA: Insufficient documentation

## 2013-04-24 NOTE — Progress Notes (Signed)
62 year old female presents with a broken and abnormal nail on 3rd digit left. Has been using vinegar water soak and cleansing but did not see any difference, so she quit using them. Relates history of having Orthotics and still wears them when wearing lace up shoes.  Objective: Weak and slim feet. Severe hypermobile first ray bilateral. Thin nail with broken distal 1/2 of the 3rd digit left. No abnormal thickness or discoloration noted. Pedal pulses are all palpable.  All digits are straight without curvature.  Assessment: Possible due to abnormal biomechanics since the first Metatarsal bone is abnormally shifted and lesser digits tend to contract at the tip.  Plan:  Upon removal of broken nail, the base nail was noted to be fairly normal. No other abnormal findings noted. Return as needed.

## 2013-04-24 NOTE — Patient Instructions (Addendum)
Seen for broken and abnormal nail on 3rd digit left. Possible due to abnormal biomechanics since the first Metatarsal bone is abnormally shifted and lesser digits tend to contract at the tip. Upon removal of broken nail, the base nail was noted to be fairly normal. No other abnormal findings noted. Return as needed.

## 2013-08-23 ENCOUNTER — Other Ambulatory Visit: Payer: Self-pay | Admitting: Obstetrics & Gynecology

## 2013-08-23 DIAGNOSIS — N6009 Solitary cyst of unspecified breast: Secondary | ICD-10-CM

## 2013-09-16 ENCOUNTER — Other Ambulatory Visit: Payer: BC Managed Care – PPO

## 2013-09-19 ENCOUNTER — Ambulatory Visit
Admission: RE | Admit: 2013-09-19 | Discharge: 2013-09-19 | Disposition: A | Discharge status: Home / Self Care | Payer: BC Managed Care – PPO | Source: Ambulatory Visit | Attending: Obstetrics & Gynecology | Admitting: Obstetrics & Gynecology

## 2013-09-19 ENCOUNTER — Ambulatory Visit
Admission: RE | Admit: 2013-09-19 | Discharge: 2013-09-19 | Disposition: A | Discharge status: Home / Self Care | Payer: Self-pay | Source: Ambulatory Visit | Attending: Obstetrics & Gynecology | Admitting: Obstetrics & Gynecology

## 2013-09-19 DIAGNOSIS — N6009 Solitary cyst of unspecified breast: Secondary | ICD-10-CM

## 2013-10-15 ENCOUNTER — Encounter: Payer: Self-pay | Admitting: General Practice

## 2013-11-18 ENCOUNTER — Encounter: Payer: Self-pay | Admitting: Family Medicine

## 2013-11-18 ENCOUNTER — Ambulatory Visit (INDEPENDENT_AMBULATORY_CARE_PROVIDER_SITE_OTHER): Payer: BC Managed Care – PPO | Admitting: Family Medicine

## 2013-11-18 VITALS — BP 118/84 | HR 93 | Temp 98.2°F | Resp 16 | Wt 95.5 lb

## 2013-11-18 DIAGNOSIS — R002 Palpitations: Secondary | ICD-10-CM | POA: Insufficient documentation

## 2013-11-18 NOTE — Progress Notes (Signed)
Pre visit review using our clinic review tool, if applicable. No additional management support is needed unless otherwise documented below in the visit note. 

## 2013-11-18 NOTE — Patient Instructions (Signed)
Please follow up by phone in 1 week to let me know if you're still having palpitations We'll notify you of your lab results and make any changes if needed Avoid caffeine If no improvement in the palpitations, we'll send you to cardiology for a complete evaluation Call with any questions or concerns Hang in there!!

## 2013-11-18 NOTE — Progress Notes (Signed)
   Subjective:    Patient ID: Patricia Chapman, female    DOB: [birth year], 63 y.o.   MRN: [id number]  HPI Tachycardia- 'i'm aware of my heart beating rapidly'.  Denies exhaustion- 'i did at one time'.  BP is elevated when compared to her usual.  No irregular heart beat.  Denies increased stress.  sxs started when she started Protriptyline.  Was only off meds x1 day prior to starting Zonisamide.  Pt reports she felt 'horrible' on this med- 'completely wiped out' and tachycardia was worse.  Denies SOB.  No CP.  No changes to OTC meds or supplements.  No change to caffeine intake- very little.   Review of Systems For ROS see HPI     Objective:   Physical Exam  Vitals reviewed. Constitutional: She is oriented to person, place, and time. She appears well-developed and well-nourished. No distress.  HENT:  Head: Normocephalic and atraumatic.  Eyes: Conjunctivae and EOM are normal. Pupils are equal, round, and reactive to light.  Neck: Normal range of motion. Neck supple. No thyromegaly present.  Cardiovascular: Normal rate, regular rhythm, normal heart sounds and intact distal pulses.   No murmur heard. Pulmonary/Chest: Effort normal and breath sounds normal. No respiratory distress.  Abdominal: Soft. She exhibits no distension. There is no tenderness.  Musculoskeletal: She exhibits no edema.  Lymphadenopathy:    She has no cervical adenopathy.  Neurological: She is alert and oriented to person, place, and time.  Skin: Skin is warm and dry.  Psychiatric: She has a normal mood and affect. Her behavior is normal.          Assessment & Plan:

## 2013-11-18 NOTE — Assessment & Plan Note (Signed)
New.  Pt not tachycardic in office today once she is able to sit and rest.  EKG shows HR 76.  Suspect this is due to recent multiple med changes.  Check labs to r/o anemia, thyroid abnormality, electrolyte disturbance.  Will give another week for med washout.  If sxs don't improve and no obvious cause on labs, will refer to cards for complete evaluation.  Pt expressed understanding and is in agreement w/ plan.

## 2013-11-19 LAB — BASIC METABOLIC PANEL
BUN: 15 mg/dL (ref 6–23)
CO2: 27 meq/L (ref 19–32)
CREATININE: 0.6 mg/dL (ref 0.4–1.2)
Calcium: 9 mg/dL (ref 8.4–10.5)
Chloride: 105 mEq/L (ref 96–112)
GFR: 111.42 mL/min (ref >60.00)
GLUCOSE: 76 mg/dL (ref 70–99)
Potassium: 3.8 mEq/L (ref 3.5–5.1)
Sodium: 141 mEq/L (ref 135–145)

## 2013-11-19 LAB — CBC WITH DIFFERENTIAL/PLATELET
BASOS ABS: 0 10*3/uL (ref 0.0–0.1)
BASOS PCT: 0.2 % (ref 0.0–3.0)
EOS ABS: 0.1 10*3/uL (ref 0.0–0.7)
Eosinophils Relative: 1.1 % (ref 0.0–5.0)
HCT: 42 % (ref 36.0–46.0)
Hemoglobin: 14 g/dL (ref 12.0–15.0)
Lymphocytes Relative: 43.8 % (ref 12.0–46.0)
Lymphs Abs: 2.4 10*3/uL (ref 0.7–4.0)
MCHC: 33.3 g/dL (ref 30.0–36.0)
MCV: 91.1 fl (ref 78.0–100.0)
MONO ABS: 0.5 10*3/uL (ref 0.1–1.0)
Monocytes Relative: 9.7 % (ref 3.0–12.0)
NEUTROS PCT: 45.2 % (ref 43.0–77.0)
Neutro Abs: 2.5 10*3/uL (ref 1.4–7.7)
PLATELETS: 307 10*3/uL (ref 150.0–400.0)
RBC: 4.61 Mil/uL (ref 3.87–5.11)
RDW: 13.1 % (ref 11.5–14.6)
WBC: 5.6 10*3/uL (ref 4.5–10.5)

## 2013-11-19 LAB — TSH: TSH: 0.45 u[IU]/mL (ref 0.35–5.50)

## 2013-11-20 ENCOUNTER — Encounter: Payer: Self-pay | Admitting: General Practice

## 2014-02-05 LAB — HM PAP SMEAR: HM PAP: NORMAL

## 2014-02-05 LAB — HM DEXA SCAN

## 2014-02-06 ENCOUNTER — Other Ambulatory Visit: Payer: Self-pay | Admitting: Obstetrics & Gynecology

## 2014-02-06 DIAGNOSIS — N6310 Unspecified lump in the right breast, unspecified quadrant: Secondary | ICD-10-CM

## 2014-02-18 ENCOUNTER — Other Ambulatory Visit: Payer: Self-pay | Admitting: Family Medicine

## 2014-02-18 ENCOUNTER — Ambulatory Visit (HOSPITAL_BASED_OUTPATIENT_CLINIC_OR_DEPARTMENT_OTHER)
Admission: RE | Admit: 2014-02-18 | Discharge: 2014-02-18 | Disposition: A | Discharge status: Home / Self Care | Payer: BC Managed Care – PPO | Source: Ambulatory Visit | Attending: Family Medicine | Admitting: Family Medicine

## 2014-02-18 ENCOUNTER — Ambulatory Visit (INDEPENDENT_AMBULATORY_CARE_PROVIDER_SITE_OTHER): Payer: BC Managed Care – PPO | Admitting: Family Medicine

## 2014-02-18 ENCOUNTER — Encounter: Payer: Self-pay | Admitting: Family Medicine

## 2014-02-18 VITALS — BP 112/68 | HR 72 | Resp 16 | Wt 97.4 lb

## 2014-02-18 DIAGNOSIS — M799 Soft tissue disorder, unspecified: Secondary | ICD-10-CM

## 2014-02-18 DIAGNOSIS — M7989 Other specified soft tissue disorders: Secondary | ICD-10-CM

## 2014-02-18 NOTE — Progress Notes (Signed)
Pre visit review using our clinic review tool, if applicable. No additional management support is needed unless otherwise documented below in the visit note. 

## 2014-02-18 NOTE — Progress Notes (Signed)
   Subjective:    Patient ID: Patricia Chapman, female    DOB: [birth year], 63 y.o.   MRN: [id number]  HPI Lump on back- pt noticed on Sunday while changing.  Had not noticed a few days prior when she was examining back for bug bites.  Not painful.  Not red.  Lump is mid back.  No other similar areas.     Review of Systems For ROS see HPI     Objective:   Physical Exam  Vitals reviewed. Constitutional: She appears well-developed and well-nourished. No distress.  Musculoskeletal: She exhibits no tenderness.  5 cm mobile, almost fluctuant mass just L of thoracic spine.  Nontender, no overlying redness          Assessment & Plan:

## 2014-02-18 NOTE — Assessment & Plan Note (Signed)
New.  Nontender.  Differential includes lipoma, sebaceous cyst, ganglion cyst, malignancy.  Get [place] to assess- will determine next steps based on results.

## 2014-02-18 NOTE — Patient Instructions (Signed)
Follow up as needed We will call you with your [place] appt and determine the appropriate next steps based on the results Please call if you develop pain, it starts rapidly enlarging or other concerns Try not to worry! Hang in there!

## 2014-02-19 ENCOUNTER — Ambulatory Visit
Admission: RE | Admit: 2014-02-19 | Discharge: 2014-02-19 | Disposition: A | Discharge status: Home / Self Care | Payer: BC Managed Care – PPO | Source: Ambulatory Visit | Attending: Obstetrics & Gynecology | Admitting: Obstetrics & Gynecology

## 2014-02-19 DIAGNOSIS — N6310 Unspecified lump in the right breast, unspecified quadrant: Secondary | ICD-10-CM

## 2014-02-28 ENCOUNTER — Encounter: Payer: Self-pay | Admitting: Internal Medicine

## 2014-05-06 ENCOUNTER — Encounter: Payer: BC Managed Care – PPO | Admitting: Internal Medicine

## 2014-05-08 ENCOUNTER — Ambulatory Visit (INDEPENDENT_AMBULATORY_CARE_PROVIDER_SITE_OTHER): Payer: BC Managed Care – PPO | Admitting: Family Medicine

## 2014-05-08 ENCOUNTER — Encounter: Payer: Self-pay | Admitting: Family Medicine

## 2014-05-08 VITALS — BP 110/70 | HR 75 | Temp 98.2°F | Resp 16 | Wt 97.0 lb

## 2014-05-08 DIAGNOSIS — Z23 Encounter for immunization: Secondary | ICD-10-CM

## 2014-05-08 DIAGNOSIS — M19042 Primary osteoarthritis, left hand: Secondary | ICD-10-CM

## 2014-05-08 MED ORDER — MELOXICAM 15 MG PO TABS: 15.0000 mg | ORAL_TABLET | Freq: Every day | ORAL | Status: DC

## 2014-05-08 NOTE — Progress Notes (Signed)
Pre visit review using our clinic review tool, if applicable. No additional management support is needed unless otherwise documented below in the visit note. 

## 2014-05-08 NOTE — Patient Instructions (Signed)
Please schedule your complete physical at your convenience We'll notify you of your Uric Acid level We'll call you with your Hand Specialist appt Start the Mobic daily for inflammation- take w/ food.  Avoid other anti-inflammatories like ibuprofen, motrin, excedrin, aleve Call with any questions or concerns Hang in there!!!

## 2014-05-08 NOTE — Progress Notes (Signed)
   Subjective:    Patient ID: Patricia Chapman, female    DOB: 1951/04/26, 63 y.o.   MRN: [id number]  HPI Hand pain- L index finger, DIP joint.  Hx of similar.  Last year had negative RF.  Pain is waking her from sleep.  Pt reports sxs were better in the summer but 'once fall came'.  sxs returned ~1 month ago.  Mild tenderness in other fingers.  Pain described as sharp.  No redness, warmth, swelling.   Review of Systems For ROS see HPI     Objective:   Physical Exam  Vitals reviewed. Constitutional: She is oriented to person, place, and time. She appears well-developed and well-nourished. No distress.  Cardiovascular: Intact distal pulses.   Musculoskeletal: She exhibits tenderness (TTP and obvious deformity of L index DIP joint).  Neurological: She is alert and oriented to person, place, and time. She has normal reflexes.  Skin: Skin is warm and dry. No erythema.          Assessment & Plan:

## 2014-05-09 LAB — URIC ACID: Uric Acid, Serum: 4.6 mg/dL (ref 2.4–7.0)

## 2014-05-12 NOTE — Assessment & Plan Note (Signed)
Deteriorated.  Pt reports she is again having severe pain that is now waking her from sleep.  Start daily Mobic.  Refer to hand specialist.  Get UA level to r/o gout although this seems unlikely.  Pt expressed understanding and is in agreement w/ plan.

## 2014-08-04 ENCOUNTER — Encounter: Payer: BC Managed Care – PPO | Admitting: Family Medicine

## 2014-08-15 ENCOUNTER — Encounter: Payer: Self-pay | Admitting: Family Medicine

## 2014-08-15 ENCOUNTER — Ambulatory Visit (INDEPENDENT_AMBULATORY_CARE_PROVIDER_SITE_OTHER): Payer: BLUE CROSS/BLUE SHIELD | Admitting: Family Medicine

## 2014-08-15 VITALS — BP 110/68 | HR 64 | Temp 97.9°F | Resp 16 | Ht 63.75 in | Wt 97.4 lb

## 2014-08-15 DIAGNOSIS — Z23 Encounter for immunization: Secondary | ICD-10-CM

## 2014-08-15 DIAGNOSIS — Z0000 Encounter for general adult medical examination without abnormal findings: Secondary | ICD-10-CM

## 2014-08-15 LAB — HEPATIC FUNCTION PANEL
ALBUMIN: 4 g/dL (ref 3.5–5.2)
ALK PHOS: 68 U/L (ref 39–117)
ALT: 13 U/L (ref 0–35)
AST: 20 U/L (ref 0–37)
BILIRUBIN TOTAL: 0.3 mg/dL (ref 0.2–1.2)
Total Protein: 6.6 g/dL (ref 6.0–8.3)

## 2014-08-15 LAB — CBC WITH DIFFERENTIAL/PLATELET
BASOS PCT: 1 % (ref 0–1)
Basophils Absolute: 0.1 10*3/uL (ref 0.0–0.1)
Eosinophils Absolute: 0.1 10*3/uL (ref 0.0–0.7)
Eosinophils Relative: 1 % (ref 0–5)
HEMATOCRIT: 41.9 % (ref 36.0–46.0)
HEMOGLOBIN: 13.8 g/dL (ref 12.0–15.0)
Lymphocytes Relative: 36 % (ref 12–46)
Lymphs Abs: 2.3 10*3/uL (ref 0.7–4.0)
MCH: 30.7 pg (ref 26.0–34.0)
MCHC: 32.9 g/dL (ref 30.0–36.0)
MCV: 93.3 fL (ref 78.0–100.0)
MONO ABS: 0.4 10*3/uL (ref 0.1–1.0)
MONOS PCT: 7 % (ref 3–12)
MPV: 9.6 fL (ref 8.6–12.4)
NEUTROS PCT: 55 % (ref 43–77)
Neutro Abs: 3.5 10*3/uL (ref 1.7–7.7)
Platelets: 293 10*3/uL (ref 150–400)
RBC: 4.49 MIL/uL (ref 3.87–5.11)
RDW: 13.9 % (ref 11.5–15.5)
WBC: 6.4 10*3/uL (ref 4.0–10.5)

## 2014-08-15 LAB — BASIC METABOLIC PANEL
BUN: 17 mg/dL (ref 6–23)
CALCIUM: 8.9 mg/dL (ref 8.4–10.5)
CO2: 27 meq/L (ref 19–32)
Chloride: 102 mEq/L (ref 96–112)
Creat: 0.88 mg/dL (ref 0.50–1.10)
GLUCOSE: 85 mg/dL (ref 70–99)
Potassium: 4.1 mEq/L (ref 3.5–5.3)
Sodium: 140 mEq/L (ref 135–145)

## 2014-08-15 LAB — LIPID PANEL
Cholesterol: 249 mg/dL — ABNORMAL HIGH (ref 0–200)
HDL: 65 mg/dL (ref >39)
LDL Cholesterol: 149 mg/dL — ABNORMAL HIGH (ref 0–99)
Total CHOL/HDL Ratio: 3.8 Ratio
Triglycerides: 173 mg/dL — ABNORMAL HIGH (ref <150)
VLDL: 35 mg/dL (ref 0–40)

## 2014-08-15 MED ORDER — MONTELUKAST SODIUM 10 MG PO TABS: 10.0000 mg | ORAL_TABLET | Freq: Every day | ORAL | Status: DC

## 2014-08-15 NOTE — Progress Notes (Signed)
   Subjective:    Patient ID: Patricia Chapman, female    DOB: [birth year], 64 y.o.   MRN: [id number]  HPI CPE- due for mammo in Feb, UTD on pap and DEXA (Dr Nori Riis).  Colonoscopy- insurance won't pay for this so pt has not had one but gets Medicare in October and will be covered at this time.   Review of Systems Patient reports no vision/ hearing changes, adenopathy,fever, weight change,  persistant/recurrent hoarseness , swallowing issues, chest pain, palpitations, edema, persistant/recurrent cough, hemoptysis, dyspnea (rest/exertional/paroxysmal nocturnal), gastrointestinal bleeding (melena, rectal bleeding), abdominal pain, significant heartburn, bowel changes, GU symptoms (dysuria, hematuria, incontinence), Gyn symptoms (abnormal  bleeding, pain),  syncope, focal weakness, memory loss, numbness & tingling, skin/hair/nail changes, abnormal bruising or bleeding, anxiety, or depression.     Objective:   Physical Exam General Appearance:    Alert, cooperative, no distress, appears stated age  Head:    Normocephalic, without obvious abnormality, atraumatic  Eyes:    PERRL, conjunctiva/corneas clear, EOM's intact, fundi    benign, both eyes  Ears:    Normal TM's and external ear canals, both ears  Nose:   Nares normal, septum midline, mucosa normal, no drainage    or sinus tenderness  Throat:   Lips, mucosa, and tongue normal; teeth and gums normal  Neck:   Supple, symmetrical, trachea midline, no adenopathy;    Thyroid: no enlargement/tenderness/nodules  Back:     Symmetric, no curvature, ROM normal, no CVA tenderness  Lungs:     Clear to auscultation bilaterally, respirations unlabored  Chest Wall:    No tenderness or deformity   Heart:    Regular rate and rhythm, S1 and S2 normal, no murmur, rub   or gallop  Breast Exam:    Deferred to GYN  Abdomen:     Soft, non-tender, bowel sounds active all four quadrants,    no masses, no organomegaly  Genitalia:    Deferred to GYN  Rectal:      Extremities:   Extremities normal, atraumatic, no cyanosis or edema  Pulses:   2+ and symmetric all extremities  Skin:   Skin color, texture, turgor normal, no rashes or lesions  Lymph nodes:   Cervical, supraclavicular, and axillary nodes normal  Neurologic:   CNII-XII intact, normal strength, sensation and reflexes    throughout          Assessment & Plan:

## 2014-08-15 NOTE — Progress Notes (Signed)
Pre visit review using our clinic review tool, if applicable. No additional management support is needed unless otherwise documented below in the visit note. 

## 2014-08-15 NOTE — Patient Instructions (Signed)
Follow up in 1 year or as needed We'll notify you of your lab results and make any changes if needed Keep up the good work!  You look great! Call with any questions or concerns Happy New Year!!! 

## 2014-08-16 LAB — VITAMIN D 25 HYDROXY (VIT D DEFICIENCY, FRACTURES): Vit D, 25-Hydroxy: 32 ng/mL (ref 30–100)

## 2014-08-16 LAB — TSH: TSH: 0.452 u[IU]/mL (ref 0.350–4.500)

## 2014-08-17 NOTE — Assessment & Plan Note (Signed)
Pt's PE WNL.  UTD on GYN.  Pt has never had colonoscopy due to lack of insurance coverage but plans on doing this next year when she has medicare.  Check labs.  Anticipatory guidance provided.

## 2014-08-18 ENCOUNTER — Other Ambulatory Visit: Payer: Self-pay | Admitting: Family Medicine

## 2014-08-18 ENCOUNTER — Other Ambulatory Visit: Payer: Self-pay | Admitting: General Practice

## 2014-08-18 DIAGNOSIS — E785 Hyperlipidemia, unspecified: Secondary | ICD-10-CM

## 2014-08-18 MED ORDER — SIMVASTATIN 20 MG PO TABS: 20.0000 mg | ORAL_TABLET | Freq: Every day | ORAL | Status: DC

## 2014-08-19 ENCOUNTER — Encounter: Payer: Self-pay | Admitting: Family Medicine

## 2014-09-16 ENCOUNTER — Other Ambulatory Visit (HOSPITAL_COMMUNITY): Payer: Self-pay | Admitting: Obstetrics & Gynecology

## 2014-09-16 DIAGNOSIS — Z1231 Encounter for screening mammogram for malignant neoplasm of breast: Secondary | ICD-10-CM

## 2014-09-30 ENCOUNTER — Ambulatory Visit (HOSPITAL_COMMUNITY): Payer: BLUE CROSS/BLUE SHIELD

## 2014-10-02 ENCOUNTER — Ambulatory Visit (HOSPITAL_COMMUNITY)
Admission: RE | Admit: 2014-10-02 | Discharge: 2014-10-02 | Disposition: A | Discharge status: Home / Self Care | Payer: BLUE CROSS/BLUE SHIELD | Source: Ambulatory Visit | Attending: Obstetrics & Gynecology | Admitting: Obstetrics & Gynecology

## 2014-10-02 DIAGNOSIS — Z1231 Encounter for screening mammogram for malignant neoplasm of breast: Secondary | ICD-10-CM | POA: Insufficient documentation

## 2014-11-18 ENCOUNTER — Encounter: Payer: Self-pay | Admitting: Family Medicine

## 2014-11-18 ENCOUNTER — Other Ambulatory Visit (INDEPENDENT_AMBULATORY_CARE_PROVIDER_SITE_OTHER): Payer: BLUE CROSS/BLUE SHIELD

## 2014-11-18 DIAGNOSIS — E785 Hyperlipidemia, unspecified: Secondary | ICD-10-CM

## 2014-11-18 LAB — LIPID PANEL
Cholesterol: 258 mg/dL — ABNORMAL HIGH (ref 0–200)
HDL: 64.1 mg/dL (ref >39.00)
LDL CALC: 169 mg/dL — AB (ref 0–99)
NonHDL: 193.9
Total CHOL/HDL Ratio: 4
Triglycerides: 125 mg/dL (ref 0.0–149.0)
VLDL: 25 mg/dL (ref 0.0–40.0)

## 2014-11-18 LAB — HEPATIC FUNCTION PANEL
ALBUMIN: 3.8 g/dL (ref 3.5–5.2)
ALT: 13 U/L (ref 0–35)
AST: 21 U/L (ref 0–37)
Alkaline Phosphatase: 73 U/L (ref 39–117)
BILIRUBIN TOTAL: 0.4 mg/dL (ref 0.2–1.2)
Bilirubin, Direct: 0.1 mg/dL (ref 0.0–0.3)
TOTAL PROTEIN: 7.1 g/dL (ref 6.0–8.3)

## 2014-11-19 ENCOUNTER — Other Ambulatory Visit: Payer: Self-pay | Admitting: General Practice

## 2014-11-19 MED ORDER — ATORVASTATIN CALCIUM 20 MG PO TABS: 20.0000 mg | ORAL_TABLET | Freq: Every day | ORAL | Status: DC

## 2014-11-19 NOTE — Telephone Encounter (Signed)
Medication filled.  

## 2015-05-28 ENCOUNTER — Ambulatory Visit (INDEPENDENT_AMBULATORY_CARE_PROVIDER_SITE_OTHER): Payer: Medicare HMO

## 2015-05-28 DIAGNOSIS — Z23 Encounter for immunization: Secondary | ICD-10-CM | POA: Diagnosis not present

## 2015-07-08 ENCOUNTER — Telehealth: Payer: Self-pay | Admitting: *Deleted

## 2015-07-08 NOTE — Telephone Encounter (Signed)
Pt signed ROI received for OV notes, labs, imaging, etc for the last 2 years. Forwarded to [place] to scan/email to medical records dept. JG//CMA

## 2015-07-23 DIAGNOSIS — G43009 Migraine without aura, not intractable, without status migrainosus: Secondary | ICD-10-CM | POA: Insufficient documentation

## 2015-07-23 DIAGNOSIS — E785 Hyperlipidemia, unspecified: Secondary | ICD-10-CM | POA: Insufficient documentation

## 2015-07-23 DIAGNOSIS — E7800 Pure hypercholesterolemia, unspecified: Secondary | ICD-10-CM | POA: Insufficient documentation

## 2015-09-22 DIAGNOSIS — D126 Benign neoplasm of colon, unspecified: Secondary | ICD-10-CM | POA: Insufficient documentation

## 2015-09-24 ENCOUNTER — Other Ambulatory Visit: Payer: Self-pay

## 2015-09-24 DIAGNOSIS — Z1231 Encounter for screening mammogram for malignant neoplasm of breast: Secondary | ICD-10-CM

## 2015-10-15 ENCOUNTER — Ambulatory Visit
Admission: RE | Admit: 2015-10-15 | Discharge: 2015-10-15 | Disposition: A | Discharge status: Home / Self Care | Payer: Medicare HMO | Source: Ambulatory Visit

## 2015-10-15 DIAGNOSIS — Z1231 Encounter for screening mammogram for malignant neoplasm of breast: Secondary | ICD-10-CM

## 2016-07-27 DIAGNOSIS — R197 Diarrhea, unspecified: Secondary | ICD-10-CM | POA: Diagnosis not present

## 2016-07-27 DIAGNOSIS — Z23 Encounter for immunization: Secondary | ICD-10-CM | POA: Diagnosis not present

## 2016-07-27 DIAGNOSIS — R32 Unspecified urinary incontinence: Secondary | ICD-10-CM | POA: Diagnosis not present

## 2016-07-27 DIAGNOSIS — Z0000 Encounter for general adult medical examination without abnormal findings: Secondary | ICD-10-CM | POA: Diagnosis not present

## 2016-07-27 DIAGNOSIS — R8290 Unspecified abnormal findings in urine: Secondary | ICD-10-CM | POA: Diagnosis not present

## 2016-07-27 DIAGNOSIS — Z681 Body mass index (BMI) 19 or less, adult: Secondary | ICD-10-CM | POA: Diagnosis not present

## 2016-08-10 DIAGNOSIS — G43009 Migraine without aura, not intractable, without status migrainosus: Secondary | ICD-10-CM | POA: Diagnosis not present

## 2016-08-15 DIAGNOSIS — H2513 Age-related nuclear cataract, bilateral: Secondary | ICD-10-CM | POA: Diagnosis not present

## 2016-08-15 DIAGNOSIS — D3131 Benign neoplasm of right choroid: Secondary | ICD-10-CM | POA: Diagnosis not present

## 2016-08-15 DIAGNOSIS — H524 Presbyopia: Secondary | ICD-10-CM | POA: Diagnosis not present

## 2016-08-15 DIAGNOSIS — H43813 Vitreous degeneration, bilateral: Secondary | ICD-10-CM | POA: Diagnosis not present

## 2016-08-23 DIAGNOSIS — D3131 Benign neoplasm of right choroid: Secondary | ICD-10-CM | POA: Diagnosis not present

## 2016-10-07 DIAGNOSIS — M19042 Primary osteoarthritis, left hand: Secondary | ICD-10-CM | POA: Diagnosis not present

## 2016-10-07 DIAGNOSIS — G8918 Other acute postprocedural pain: Secondary | ICD-10-CM | POA: Diagnosis not present

## 2016-10-12 DIAGNOSIS — H109 Unspecified conjunctivitis: Secondary | ICD-10-CM | POA: Diagnosis not present

## 2016-10-12 DIAGNOSIS — J069 Acute upper respiratory infection, unspecified: Secondary | ICD-10-CM | POA: Diagnosis not present

## 2016-10-19 ENCOUNTER — Other Ambulatory Visit: Payer: Self-pay | Admitting: Obstetrics & Gynecology

## 2016-10-19 DIAGNOSIS — Z1231 Encounter for screening mammogram for malignant neoplasm of breast: Secondary | ICD-10-CM

## 2016-10-20 DIAGNOSIS — Z23 Encounter for immunization: Secondary | ICD-10-CM | POA: Diagnosis not present

## 2016-10-20 DIAGNOSIS — Z411 Encounter for cosmetic surgery: Secondary | ICD-10-CM | POA: Diagnosis not present

## 2016-10-20 DIAGNOSIS — M79645 Pain in left finger(s): Secondary | ICD-10-CM | POA: Diagnosis not present

## 2016-10-20 DIAGNOSIS — L309 Dermatitis, unspecified: Secondary | ICD-10-CM | POA: Diagnosis not present

## 2016-10-25 DIAGNOSIS — M19042 Primary osteoarthritis, left hand: Secondary | ICD-10-CM | POA: Diagnosis not present

## 2016-11-08 ENCOUNTER — Ambulatory Visit
Admission: RE | Admit: 2016-11-08 | Discharge: 2016-11-08 | Disposition: A | Discharge status: Home / Self Care | Payer: Medicare HMO | Source: Ambulatory Visit | Attending: Obstetrics & Gynecology | Admitting: Obstetrics & Gynecology

## 2016-11-08 DIAGNOSIS — Z1231 Encounter for screening mammogram for malignant neoplasm of breast: Secondary | ICD-10-CM

## 2016-11-10 DIAGNOSIS — M79645 Pain in left finger(s): Secondary | ICD-10-CM | POA: Diagnosis not present

## 2016-11-10 DIAGNOSIS — Z981 Arthrodesis status: Secondary | ICD-10-CM | POA: Diagnosis not present

## 2016-11-17 DIAGNOSIS — G43019 Migraine without aura, intractable, without status migrainosus: Secondary | ICD-10-CM | POA: Diagnosis not present

## 2016-12-01 DIAGNOSIS — Z4789 Encounter for other orthopedic aftercare: Secondary | ICD-10-CM | POA: Diagnosis not present

## 2016-12-13 DIAGNOSIS — Z79899 Other long term (current) drug therapy: Secondary | ICD-10-CM | POA: Diagnosis not present

## 2016-12-13 DIAGNOSIS — G43019 Migraine without aura, intractable, without status migrainosus: Secondary | ICD-10-CM | POA: Diagnosis not present

## 2017-01-03 DIAGNOSIS — M79645 Pain in left finger(s): Secondary | ICD-10-CM | POA: Diagnosis not present

## 2017-01-24 DIAGNOSIS — R2689 Other abnormalities of gait and mobility: Secondary | ICD-10-CM | POA: Diagnosis not present

## 2017-02-22 DIAGNOSIS — G43009 Migraine without aura, not intractable, without status migrainosus: Secondary | ICD-10-CM | POA: Diagnosis not present

## 2017-02-22 DIAGNOSIS — G44219 Episodic tension-type headache, not intractable: Secondary | ICD-10-CM | POA: Diagnosis not present

## 2017-02-23 DIAGNOSIS — Z86018 Personal history of other benign neoplasm: Secondary | ICD-10-CM | POA: Diagnosis not present

## 2017-02-23 DIAGNOSIS — L680 Hirsutism: Secondary | ICD-10-CM | POA: Diagnosis not present

## 2017-02-23 DIAGNOSIS — D2272 Melanocytic nevi of left lower limb, including hip: Secondary | ICD-10-CM | POA: Diagnosis not present

## 2017-02-23 DIAGNOSIS — D225 Melanocytic nevi of trunk: Secondary | ICD-10-CM | POA: Diagnosis not present

## 2017-02-23 DIAGNOSIS — L821 Other seborrheic keratosis: Secondary | ICD-10-CM | POA: Diagnosis not present

## 2017-02-24 DIAGNOSIS — G43009 Migraine without aura, not intractable, without status migrainosus: Secondary | ICD-10-CM | POA: Diagnosis not present

## 2017-02-24 DIAGNOSIS — Z79899 Other long term (current) drug therapy: Secondary | ICD-10-CM | POA: Diagnosis not present

## 2017-05-11 DIAGNOSIS — R351 Nocturia: Secondary | ICD-10-CM | POA: Diagnosis not present

## 2017-05-11 DIAGNOSIS — Z681 Body mass index (BMI) 19 or less, adult: Secondary | ICD-10-CM | POA: Diagnosis not present

## 2017-05-11 DIAGNOSIS — Z01419 Encounter for gynecological examination (general) (routine) without abnormal findings: Secondary | ICD-10-CM | POA: Diagnosis not present

## 2017-05-30 DIAGNOSIS — Z23 Encounter for immunization: Secondary | ICD-10-CM | POA: Diagnosis not present

## 2017-06-05 DIAGNOSIS — Z79899 Other long term (current) drug therapy: Secondary | ICD-10-CM | POA: Diagnosis not present

## 2017-07-04 DIAGNOSIS — Z681 Body mass index (BMI) 19 or less, adult: Secondary | ICD-10-CM | POA: Diagnosis not present

## 2017-07-04 DIAGNOSIS — G43909 Migraine, unspecified, not intractable, without status migrainosus: Secondary | ICD-10-CM | POA: Diagnosis not present

## 2017-07-04 DIAGNOSIS — Z791 Long term (current) use of non-steroidal anti-inflammatories (NSAID): Secondary | ICD-10-CM | POA: Diagnosis not present

## 2017-07-04 DIAGNOSIS — Z0000 Encounter for general adult medical examination without abnormal findings: Secondary | ICD-10-CM | POA: Diagnosis not present

## 2017-07-04 DIAGNOSIS — N3281 Overactive bladder: Secondary | ICD-10-CM | POA: Diagnosis not present

## 2017-07-11 ENCOUNTER — Ambulatory Visit: Payer: Medicare HMO | Admitting: Neurology

## 2017-07-11 ENCOUNTER — Encounter: Payer: Self-pay | Admitting: Neurology

## 2017-07-11 VITALS — BP 94/52 | HR 78 | Ht 63.75 in | Wt 96.5 lb

## 2017-07-11 DIAGNOSIS — G43709 Chronic migraine without aura, not intractable, without status migrainosus: Secondary | ICD-10-CM | POA: Insufficient documentation

## 2017-07-11 DIAGNOSIS — IMO0002 Reserved for concepts with insufficient information to code with codable children: Secondary | ICD-10-CM

## 2017-07-11 MED ORDER — DIVALPROEX SODIUM ER 250 MG PO TB24: 750.0000 mg | ORAL_TABLET | Freq: Every day | ORAL | 4 refills | Status: DC

## 2017-07-11 MED ORDER — FREMANEZUMAB-VFRM 225 MG/1.5ML ~~LOC~~ SOSY: 225.0000 mg | PREFILLED_SYRINGE | SUBCUTANEOUS | 11 refills | Status: DC

## 2017-07-11 MED ORDER — ONDANSETRON 4 MG PO TBDP: 4.0000 mg | ORAL_TABLET | Freq: Three times a day (TID) | ORAL | 6 refills | Status: DC | PRN

## 2017-07-11 MED ORDER — TIZANIDINE HCL 2 MG PO TABS: 2.0000 mg | ORAL_TABLET | Freq: Three times a day (TID) | ORAL | 6 refills | Status: DC | PRN

## 2017-07-11 MED ORDER — ZOLMITRIPTAN 5 MG PO TBDP: 5.0000 mg | ORAL_TABLET | ORAL | 4 refills | Status: DC | PRN

## 2017-07-11 NOTE — Progress Notes (Signed)
PATIENT: Patricia Chapman DOB: [birth year]  Chief Complaint  Patient presents with  . Migraine    She is here to establish care for her migraines.   She was previously treated by Dr. Catalina Gravel.  She averages 7-8 migraines per month.  She feels Zomig use to work better for her than now.  As a preventive, she takes Depakote ER 750mg  daily.  Marland Kitchen PCP    Podraza, Sindy Messing, PA-C     HISTORICAL  Patricia Chapman  is a 66 year old female, seen in refer by primary care PA Podraza, Sindy Messing for evaluation of chronic migraine headache, initial evaluation was on July 11, 2017.  She reported history of migraine headaches since her early 62s, typical migraine are lateralized severe pounding headaches with associated light noise sensitivity, no nausea, lasting for a few hours,  Previous trigger for her migraines are wine, perfume, bright light, stress, not sleeping well, weather changes,  Over the years, she suffers severe, frequent migraine headaches, even now, taking Depakote ER 250 mg 3 tablets every night as preventive medications, she has average 6-8 migraine days in a month, her migraine usually come in clusters, each 1 lasts for a few days, she is now taking Zomig 5 mg as needed, it will get rid of her headache within 1 hour, but she usually has recurrent headaches in the next few days, she tends to hold off Zomig at the end of the day, because it caused her sleepiness, never tried combination medications as abortive treatment.  I reviewed most recent office note from Dr. Catalina Gravel on November 17, 2016:She has tried and failed multiple preventive medications in the past, including gabapentin, Inderal, imipramine, Topamax developed rash,, amitriptyline, nortriptyline, Effexor, verapamil, protriptyline, Zonegran, Keppra, memantine causing mood difficulty, could not tolerate magnesium.  Botox injection twice showed no significant benefit, February 28, 2014 [facility] neurostimulator unit, acupuncture,  riboflavin, Q enzyme Q10, has not been helpful,  For abortive treatment, she has tried Imitrex, Maxalt, Zomig seems to work the past, which is not well covered by her insurance,  Laboratory evaluation in July 2018: Normal CBC, hemoglobin was 14.1, AST was 45, ALT was 30, hemoglobin was 12.7, normal TSH, free T4, total cholesterol was 209, LDL was 121,  REVIEW OF SYSTEMS: Full 14 system review of systems performed and notable only for as above  ALLERGIES: Allergies  Allergen Reactions  . Penicillins     HOME MEDICATIONS: Current Outpatient Medications  Medication Sig Dispense Refill  . divalproex (DEPAKOTE ER) 250 MG 24 hr tablet Take 750 mg by mouth daily.    Marland Kitchen MYRBETRIQ 50 MG TB24 tablet Take 50 mg by mouth daily.    . naproxen (NAPROSYN) 500 MG tablet Take 500 mg by mouth as needed.     . zolmitriptan (ZOMIG-ZMT) 5 MG disintegrating tablet Take 5 mg by mouth as needed.   0   No current facility-administered medications for this visit.     PAST MEDICAL HISTORY: Past Medical History:  Diagnosis Date  . Chicken pox   . Migraines     PAST SURGICAL HISTORY: Past Surgical History:  Procedure Laterality Date  . BREAST BIOPSY    . BREAST EXCISIONAL BIOPSY Right 1991   benign  . FINGER SURGERY Left     FAMILY HISTORY: Family History  Problem Relation Age of Onset  . Hyperlipidemia Mother   . Stroke Mother   . Hypertension Mother   . Congestive Heart Failure Mother   . Heart disease Maternal Grandmother   .  Kidney disease Maternal Grandfather   . Stroke Father     SOCIAL HISTORY:  Social History   Socioeconomic History  . Marital status: Married    Spouse name: Not on file  . Number of children: 2  . Years of education: 16  . Highest education level: Bachelor's degree (e.g., BA, AB, BS)  Social Needs  . Financial resource strain: Not on file  . Food insecurity - worry: Not on file  . Food insecurity - inability: Not on file  . Transportation needs - medical:  Not on file  . Transportation needs - non-medical: Not on file  Occupational History  . Occupation: Part-time for her self-owned print shop  Tobacco Use  . Smoking status: Never Smoker  . Smokeless tobacco: Never Used  Substance and Sexual Activity  . Alcohol use: No    Frequency: Never  . Drug use: No  . Sexual activity: No  Other Topics Concern  . Not on file  Social History Narrative   Lives at home with her husband.   Right-handed.   No caffeine use.     PHYSICAL EXAM   Vitals:   07/11/17 1325  BP: (!) 94/52  Pulse: 78  Weight: 96 lb 8 oz (43.8 kg)  Height: 5' 3.75" (1.619 m)    Not recorded      Body mass index is 16.69 kg/m.  PHYSICAL EXAMNIATION:  Gen: NAD, conversant, well nourised, obese, well groomed                     Cardiovascular: Regular rate rhythm, no peripheral edema, warm, nontender. Eyes: Conjunctivae clear without exudates or hemorrhage Neck: Supple, no carotid bruits. Pulmonary: Clear to auscultation bilaterally   NEUROLOGICAL EXAM:  MENTAL STATUS: Speech:    Speech is normal; fluent and spontaneous with normal comprehension.  Cognition:     Orientation to time, place and person     Normal recent and remote memory     Normal Attention span and concentration     Normal Language, naming, repeating,spontaneous speech     Fund of knowledge   CRANIAL NERVES: CN II: Visual fields are full to confrontation. Fundoscopic exam is normal with sharp discs and no vascular changes. Pupils are round equal and briskly reactive to light. CN III, IV, VI: extraocular movement are normal. No ptosis. CN V: Facial sensation is intact to pinprick in all 3 divisions bilaterally. Corneal responses are intact.  CN VII: Face is symmetric with normal eye closure and smile. CN VIII: Hearing is normal to rubbing fingers CN IX, X: Palate elevates symmetrically. Phonation is normal. CN XI: Head turning and shoulder shrug are intact CN XII: Tongue is midline  with normal movements and no atrophy.  MOTOR: There is no pronator drift of out-stretched arms. Muscle bulk and tone are normal. Muscle strength is normal.  REFLEXES: Reflexes are 2+ and symmetric at the biceps, triceps, knees, and ankles. Plantar responses are flexor.  SENSORY: Intact to light touch, pinprick, positional sensation and vibratory sensation are intact in fingers and toes.  COORDINATION: Rapid alternating movements and fine finger movements are intact. There is no dysmetria on finger-to-nose and heel-knee-shin.    GAIT/STANCE: Posture is normal. Gait is steady with normal steps, base, arm swing, and turning. Heel and toe walking are normal. Tandem gait is normal.  Romberg is absent.   DIAGNOSTIC DATA (LABS, IMAGING, TESTING) - I reviewed patient records, labs, notes, testing and imaging myself where available.   ASSESSMENT  AND PLAN  Jamaris Biernat is a 66 y.o. female    Chronic migraine headaches  Tried and failed multiple preventive medications encompass different category of medications, including antiepileptics, beta-blocker, calcium channel blocker, anti-depression, Botox,  Keep current dose of Depakote ER 250 mg 3 tablets every night as preventive medication  Will try CGRP monoclonal antibody Ajovy as preventive medications.  Zomig plus zofran, Alevel, Tizanidine as needed.   Marcial Pacas, M.D. Ph.D.  [facility] [address], [place], [place] [address] Ph: [phone] Fax: [phone]  CC: Jonathon Bellows, PA-C

## 2017-07-28 DIAGNOSIS — D72819 Decreased white blood cell count, unspecified: Secondary | ICD-10-CM | POA: Diagnosis not present

## 2017-07-28 DIAGNOSIS — E785 Hyperlipidemia, unspecified: Secondary | ICD-10-CM | POA: Diagnosis not present

## 2017-07-28 DIAGNOSIS — Z0000 Encounter for general adult medical examination without abnormal findings: Secondary | ICD-10-CM | POA: Diagnosis not present

## 2017-08-09 DIAGNOSIS — R8290 Unspecified abnormal findings in urine: Secondary | ICD-10-CM | POA: Diagnosis not present

## 2017-08-14 DIAGNOSIS — H2513 Age-related nuclear cataract, bilateral: Secondary | ICD-10-CM | POA: Diagnosis not present

## 2017-08-14 DIAGNOSIS — H524 Presbyopia: Secondary | ICD-10-CM | POA: Diagnosis not present

## 2017-08-14 DIAGNOSIS — H43813 Vitreous degeneration, bilateral: Secondary | ICD-10-CM | POA: Diagnosis not present

## 2017-08-14 DIAGNOSIS — D3131 Benign neoplasm of right choroid: Secondary | ICD-10-CM | POA: Diagnosis not present

## 2017-10-02 DIAGNOSIS — R42 Dizziness and giddiness: Secondary | ICD-10-CM | POA: Diagnosis not present

## 2017-10-02 DIAGNOSIS — R269 Unspecified abnormalities of gait and mobility: Secondary | ICD-10-CM | POA: Insufficient documentation

## 2017-10-06 DIAGNOSIS — Z0100 Encounter for examination of eyes and vision without abnormal findings: Secondary | ICD-10-CM | POA: Diagnosis not present

## 2017-10-10 DIAGNOSIS — R2689 Other abnormalities of gait and mobility: Secondary | ICD-10-CM | POA: Diagnosis not present

## 2017-10-10 DIAGNOSIS — R269 Unspecified abnormalities of gait and mobility: Secondary | ICD-10-CM | POA: Diagnosis not present

## 2017-10-12 ENCOUNTER — Ambulatory Visit: Payer: Medicare HMO | Admitting: Neurology

## 2017-10-12 ENCOUNTER — Encounter: Payer: Self-pay | Admitting: Neurology

## 2017-10-12 VITALS — BP 108/62 | HR 81 | Ht 63.75 in | Wt 99.0 lb

## 2017-10-12 DIAGNOSIS — G43709 Chronic migraine without aura, not intractable, without status migrainosus: Secondary | ICD-10-CM

## 2017-10-12 DIAGNOSIS — IMO0002 Reserved for concepts with insufficient information to code with codable children: Secondary | ICD-10-CM

## 2017-10-12 MED ORDER — GALCANEZUMAB-GNLM 120 MG/ML ~~LOC~~ SOAJ: 2.0000 "pen " | Freq: Once | SUBCUTANEOUS | 0 refills | Status: AC

## 2017-10-12 MED ORDER — GALCANEZUMAB-GNLM 120 MG/ML ~~LOC~~ SOAJ: 120.0000 mg | SUBCUTANEOUS | 11 refills | Status: DC

## 2017-10-12 NOTE — Progress Notes (Signed)
Received VO from Dr. Krista Blue to give 2 doses of emgality to pt as maintenance dose. Emgality injections given to pt. Both pens were allowed to sit at room temperature for 30 mins prior to injections. 1 injection in right anterior thigh, second injection in left anterior thigh. I instructed pt on correct emgality administration. Injection sites cleaned with alcohol wipe prior to injection, let dry. Gauze applied to injection sites post injection. Pt tolerated procedure well and left office in NAD.  Emgality 120 mg/mL single dose prefilled syringes x2 Lot: [id number] E Exp: 12/2018

## 2017-10-12 NOTE — Progress Notes (Signed)
PATIENT: Patricia Chapman DOB: 1951-08-01  Chief Complaint  Patient presents with  . Migraine    PCP: Dr. Hoy Morn. Patient reports that her migraines are a little worse. She could not get the Ajovy due to cost.      HISTORICAL  Patricia Chapman  is a 67 year old female, seen in refer by primary care PA Podraza, Sindy Messing for evaluation of chronic migraine headache, initial evaluation was on July 11, 2017.  She reported history of migraine headaches since her early 53s, typical migraine are lateralized severe pounding headaches with associated light noise sensitivity, no nausea, lasting for a few hours,  Previous trigger for her migraines are wine, perfume, bright light, stress, not sleeping well, weather changes,  Over the years, she suffers severe, frequent migraine headaches, even now, taking Depakote ER 250 mg 3 tablets every night as preventive medications, she has average 6-8 migraine days in a month, her migraine usually come in clusters, each 1 lasts for a few days, she is now taking Zomig 5 mg as needed, it will get rid of her headache within 1 hour, but she usually has recurrent headaches in the next few days, she tends to hold off Zomig at the end of the day, because it caused her sleepiness, never tried combination medications as abortive treatment.  I reviewed most recent office note from Dr. Catalina Gravel on November 17, 2016:She has tried and failed multiple preventive medications in the past, including gabapentin, Inderal, imipramine, Topamax developed rash,, amitriptyline, nortriptyline, Effexor, verapamil, protriptyline, Zonegran, Keppra, memantine causing mood difficulty, could not tolerate magnesium.  Botox injection twice showed no significant benefit, February 28, 2014 [facility] neurostimulator unit, acupuncture, riboflavin, Q enzyme Q10, has not been helpful,  For abortive treatment, she has tried Imitrex, Maxalt, Zomig seems to work the past, which is not well covered by her  insurance,  Laboratory evaluation in July 2018: Normal CBC, hemoglobin was 14.1, AST was 45, ALT was 30, hemoglobin was 12.7, normal TSH, free T4, total cholesterol was 209, LDL was 121,  UPDATE October 12 2017: She is now taking Depkote ER 750mg  qhs since 2017, she think Depakote has lost the benefit, she reported frequent migraine headaches, in January 14, February 13, severe migraine holoacranial pounding headache with light noise sensitivity, lasting for hours even after Zomig, she has tried combination of Zomig with Aleve, Zofran, tizanidine, which did not provide better and quicker relief, in between the headaches, she often feel wiped out, previously tried Botox twice without helping her headaches,  Insurance would not pay for Ajovy per patient.  CAT scan without contrast performed March 5th, 2019 from [facility] was normal  REVIEW OF SYSTEMS: Full 14 system review of systems performed and notable only for as above  ALLERGIES: Allergies  Allergen Reactions  . Penicillins     HOME MEDICATIONS: Current Outpatient Medications  Medication Sig Dispense Refill  . divalproex (DEPAKOTE ER) 250 MG 24 hr tablet Take 3 tablets (750 mg total) by mouth daily. 270 tablet 4  . MYRBETRIQ 50 MG TB24 tablet Take 50 mg by mouth daily.    . naproxen (NAPROSYN) 500 MG tablet Take 500 mg by mouth as needed.     . ondansetron (ZOFRAN ODT) 4 MG disintegrating tablet Take 1 tablet (4 mg total) by mouth every 8 (eight) hours as needed. 20 tablet 6  . tiZANidine (ZANAFLEX) 2 MG tablet Take 1 tablet (2 mg total) by mouth every 8 (eight) hours as needed for muscle spasms.  20 tablet 6  . zolmitriptan (ZOMIG-ZMT) 5 MG disintegrating tablet Take 1 tablet (5 mg total) by mouth as needed. 30 tablet 4  . Fremanezumab-vfrm (AJOVY) 225 MG/1.5ML SOSY Inject 225 mg into the skin every 30 (thirty) days. (Patient not taking: Reported on 10/12/2017) 1 Syringe 11   No current facility-administered medications  for this visit.     PAST MEDICAL HISTORY: Past Medical History:  Diagnosis Date  . Chicken pox   . Migraines     PAST SURGICAL HISTORY: Past Surgical History:  Procedure Laterality Date  . BREAST BIOPSY    . BREAST EXCISIONAL BIOPSY Right 1991   benign  . FINGER SURGERY Left     FAMILY HISTORY: Family History  Problem Relation Age of Onset  . Hyperlipidemia Mother   . Stroke Mother   . Hypertension Mother   . Congestive Heart Failure Mother   . Heart disease Maternal Grandmother   . Kidney disease Maternal Grandfather   . Stroke Father     SOCIAL HISTORY:  Social History   Socioeconomic History  . Marital status: Married    Spouse name: Not on file  . Number of children: 2  . Years of education: 16  . Highest education level: Bachelor's degree (e.g., BA, AB, BS)  Social Needs  . Financial resource strain: Not on file  . Food insecurity - worry: Not on file  . Food insecurity - inability: Not on file  . Transportation needs - medical: Not on file  . Transportation needs - non-medical: Not on file  Occupational History  . Occupation: Part-time for her self-owned print shop  Tobacco Use  . Smoking status: Never Smoker  . Smokeless tobacco: Never Used  Substance and Sexual Activity  . Alcohol use: No    Frequency: Never  . Drug use: No  . Sexual activity: No  Other Topics Concern  . Not on file  Social History Narrative   Lives at home with her husband.   Right-handed.   No caffeine use.     PHYSICAL EXAM   Vitals:   10/12/17 1125  BP: 108/62  Pulse: 81  Weight: 99 lb (44.9 kg)  Height: 5' 3.75" (1.619 m)    Not recorded      Body mass index is 17.13 kg/m.  PHYSICAL EXAMNIATION:  Gen: NAD, conversant, well nourised, obese, well groomed                     Cardiovascular: Regular rate rhythm, no peripheral edema, warm, nontender. Eyes: Conjunctivae clear without exudates or hemorrhage Neck: Supple, no carotid bruits. Pulmonary: Clear  to auscultation bilaterally   NEUROLOGICAL EXAM:  MENTAL STATUS: Speech:    Speech is normal; fluent and spontaneous with normal comprehension.  Cognition:     Orientation to time, place and person     Normal recent and remote memory     Normal Attention span and concentration     Normal Language, naming, repeating,spontaneous speech     Fund of knowledge   CRANIAL NERVES: CN II: Visual fields are full to confrontation. Fundoscopic exam is normal with sharp discs and no vascular changes. Pupils are round equal and briskly reactive to light. CN III, IV, VI: extraocular movement are normal. No ptosis. CN V: Facial sensation is intact to pinprick in all 3 divisions bilaterally. Corneal responses are intact.  CN VII: Face is symmetric with normal eye closure and smile. CN VIII: Hearing is normal to rubbing fingers CN IX,  X: Palate elevates symmetrically. Phonation is normal. CN XI: Head turning and shoulder shrug are intact CN XII: Tongue is midline with normal movements and no atrophy.  MOTOR: There is no pronator drift of out-stretched arms. Muscle bulk and tone are normal. Muscle strength is normal.  REFLEXES: Reflexes are 2+ and symmetric at the biceps, triceps, knees, and ankles. Plantar responses are flexor.  SENSORY: Intact to light touch, pinprick, positional sensation and vibratory sensation are intact in fingers and toes.  COORDINATION: Rapid alternating movements and fine finger movements are intact. There is no dysmetria on finger-to-nose and heel-knee-shin.    GAIT/STANCE: Posture is normal. Gait is steady with normal steps, base, arm swing, and turning. Heel and toe walking are normal. Tandem gait is normal.  Romberg is absent.   DIAGNOSTIC DATA (LABS, IMAGING, TESTING) - I reviewed patient records, labs, notes, testing and imaging myself where available.   ASSESSMENT AND PLAN  Merry Pond is a 67 y.o. female    Chronic migraine headaches  Tried and failed  multiple preventive medications encompass different category of medications, including antiepileptics, beta-blocker, calcium channel blocker, anti-depression, Botox,  Keep current dose of Depakote ER 250 mg 3 tablets every night as preventive medication  Will try CGRP monoclonal antibody Emgality as preventive medications.give her sample  120mg  x2 today as office  Zomig plus zofran, Alevel, Tizanidine as needed.   Follow-up with nurse practitioner/Dr. Freddie Apley, M.D. Ph.D.  [facility] [address], [place], [place] [address] Ph: [phone] Fax: [phone]  CC: Jonathon Bellows, PA-C

## 2017-10-23 DIAGNOSIS — R69 Illness, unspecified: Secondary | ICD-10-CM | POA: Diagnosis not present

## 2017-10-25 ENCOUNTER — Telehealth: Payer: Self-pay | Admitting: *Deleted

## 2017-10-25 NOTE — Telephone Encounter (Signed)
PA approved by Holland Falling [phone]) through 08/07/2018.  Referral #[id number].  Member ID# [id number].  Reports failure to the following: divalproex, gabapentin, propranolol, imipramine, topiramate, amitriptyline, nortripline, venlafaxine, protriptyline, zonisamide, levetiracetam, magnesium, Botox, riboflavin, CoQ10, sumatriptyline, zomitriptan, rizatriptan, naproxen, ibuprofen, tizanidine.  Also, Cafley neurostimulaor unit.

## 2018-01-12 ENCOUNTER — Other Ambulatory Visit: Payer: Self-pay | Admitting: Obstetrics & Gynecology

## 2018-01-12 DIAGNOSIS — Z1231 Encounter for screening mammogram for malignant neoplasm of breast: Secondary | ICD-10-CM

## 2018-01-17 ENCOUNTER — Encounter: Payer: Self-pay | Admitting: Neurology

## 2018-01-17 ENCOUNTER — Ambulatory Visit: Payer: Medicare HMO | Admitting: Neurology

## 2018-01-17 VITALS — BP 110/55 | HR 80 | Ht 63.0 in | Wt 100.0 lb

## 2018-01-17 DIAGNOSIS — G43009 Migraine without aura, not intractable, without status migrainosus: Secondary | ICD-10-CM

## 2018-01-17 MED ORDER — ZOLMITRIPTAN 5 MG PO TBDP: 5.0000 mg | ORAL_TABLET | ORAL | 11 refills | Status: DC | PRN

## 2018-01-17 MED ORDER — ZOLMITRIPTAN 5 MG NA SOLN: 1.0000 | NASAL | 0 refills | Status: DC | PRN

## 2018-01-17 MED ORDER — NAPROXEN 500 MG PO TABS
500.0000 mg | ORAL_TABLET | ORAL | 11 refills | Status: DC | PRN
Start: 2018-01-17 — End: 2019-07-10

## 2018-01-17 NOTE — Progress Notes (Signed)
[facility]    Provider:  Dr Jaynee Eagles Referring Provider: Keane Scrape* Primary Care Physician:  Jonathon Bellows, PA-C  CC:  migraines  HPI:  Patricia Chapman is a 67 y.o. female here as a referral from Dr. Hoy Morn for migraines. She was a patient of dr. Earley Favor and then Dr. Melton Alar.  She has had headaches since her early 61s, typical migraines are lateralized severe pounding headaches with associated light noise sensitivity, no nausea, lasting 4 to 72 hours.  Previous triggers were wind perfume bright light stress not sleeping well and weather changes.  She has had 3 shots of Emgality. She has only had 4 headache days last month. In January had 15.  We discussed migraines and migraine management, at this point she appears to be doing well on and reality.  Her migraine frequency has been reduced.  The for headache days last month were migrainous however they were less severe no known inciting events or triggers, they resolve with acute management.  Reviewed notes, labs and imaging from outside physicians, which showed:  Reviewed prior notes.  Patient had headaches for almost 50 years.  She had seen multiple neurologist in the pack.  Typical migraines are unilateral severe pulsating with associated photophobia and phonophobia but without nausea or vomiting.  Over the year she suffered with severe frequent migraine headaches.  Currently on Depakote.  Last visit with Dr. Krista Blue prior to starting a modality she reported 8 migraine days in a month minimum taking Zomig as needed with breakthrough headaches.  Dr. Krista Blue reviewed notes from Dr. Bobby Rumpf.  She is tried and failed multiple preventative medications in the past including:  Gabapentin, Inderal, imipramine, Topamax (developed rash), amitriptyline, nortriptyline, Effexor, verapamil, protriptyline, Zonegran, Keppra, memantine which caused mood difficulty, could not tolerate magnesium.  Also had no significant benefit with  Botox injections x2.  She also had neurostimulator unit, acupuncture, riboflavin, Q enzyme every 10 which have not been helpful.  For abortive treatment she is tried Imitrex, Maxalt, Zomig.  TSH nml in 2016   Review of Systems: Patient complains of symptoms per HPI as well as the following symptoms: migraines. Pertinent negatives and positives per HPI. All others negative.   Social History   Socioeconomic History  . Marital status: Married    Spouse name: Not on file  . Number of children: 2  . Years of education: 16  . Highest education level: Bachelor's degree (e.g., BA, AB, BS)  Occupational History  . Occupation: Part-time for her self-owned print shop  Social Needs  . Financial resource strain: Not on file  . Food insecurity:    Worry: Not on file    Inability: Not on file  . Transportation needs:    Medical: Not on file    Non-medical: Not on file  Tobacco Use  . Smoking status: Never Smoker  . Smokeless tobacco: Never Used  Substance and Sexual Activity  . Alcohol use: No    Frequency: Never  . Drug use: No  . Sexual activity: Never  Lifestyle  . Physical activity:    Days per week: Not on file    Minutes per session: Not on file  . Stress: Not on file  Relationships  . Social connections:    Talks on phone: Not on file    Gets together: Not on file    Attends religious service: Not on file    Active member of club or organization: Not on file    Attends meetings of  clubs or organizations: Not on file    Relationship status: Not on file  . Intimate partner violence:    Fear of current or ex partner: Not on file    Emotionally abused: Not on file    Physically abused: Not on file    Forced sexual activity: Not on file  Other Topics Concern  . Not on file  Social History Narrative   Lives at home with her husband.   Right-handed.   No caffeine use.   She is going to be a grandmother December 2019     Family History  Problem Relation Age of Onset  .  Hyperlipidemia Mother   . Stroke Mother   . Hypertension Mother   . Congestive Heart Failure Mother   . Heart disease Maternal Grandmother   . Kidney disease Maternal Grandfather   . Stroke Father     Past Medical History:  Diagnosis Date  . Chicken pox   . Migraines     Past Surgical History:  Procedure Laterality Date  . BREAST BIOPSY    . BREAST EXCISIONAL BIOPSY Right 1991   benign  . FINGER SURGERY Left     Current Outpatient Medications  Medication Sig Dispense Refill  . divalproex (DEPAKOTE ER) 250 MG 24 hr tablet Take 3 tablets (750 mg total) by mouth daily. 270 tablet 4  . Galcanezumab-gnlm (EMGALITY) 120 MG/ML SOAJ Inject 120 mg into the skin every 30 (thirty) days. 1 pen 11  . MYRBETRIQ 50 MG TB24 tablet Take 50 mg by mouth daily.    Marland Kitchen zolmitriptan (ZOMIG-ZMT) 5 MG disintegrating tablet Take 1 tablet (5 mg total) by mouth as needed. 10 tablet 11  . naproxen (NAPROSYN) 500 MG tablet Take 1 tablet (500 mg total) by mouth as needed. 30 tablet 11  . zolmitriptan (ZOMIG) 5 MG nasal solution Place 1 spray into the nose as needed for migraine. May repeat in 2 hours. Max 2x one day 6 Units 0   No current facility-administered medications for this visit.     Allergies as of 01/17/2018 - Review Complete 01/17/2018  Allergen Reaction Noted  . Penicillins  07/13/2010    Vitals: BP (!) 110/55 (BP Location: Left Arm, Patient Position: Sitting)   Pulse 80   Ht 5\' 3"  (1.6 m)   Wt 100 lb (45.4 kg)   BMI 17.71 kg/m  Last Weight:  Wt Readings from Last 1 Encounters:  01/17/18 100 lb (45.4 kg)   Last Height:   Ht Readings from Last 1 Encounters:  01/17/18 5\' 3"  (1.6 m)   Physical exam: Exam: Gen: NAD, conversant, well nourised, well groomed                     CV: RRR, no MRG. No Carotid Bruits. No peripheral edema, warm, nontender Eyes: Conjunctivae clear without exudates or hemorrhage  Neuro: Detailed Neurologic Exam  Speech:    Speech is normal; fluent and  spontaneous with normal comprehension.  Cognition:    The patient is oriented to person, place, and time;     recent and remote memory intact;     language fluent;     normal attention, concentration,     fund of knowledge Cranial Nerves:    The pupils are equal, round, and reactive to light. The fundi are normal and spontaneous venous pulsations are present. Visual fields are full to finger confrontation. Extraocular movements are intact. Trigeminal sensation is intact and the muscles of mastication are normal.  The face is symmetric. The palate elevates in the midline. Hearing intact. Voice is normal. Shoulder shrug is normal. The tongue has normal motion without fasciculations.   Coordination:    Normal finger to nose and heel to shin. Normal rapid alternating movements.   Gait:    Heel-toe and tandem gait are normal.   Motor Observation:    No asymmetry, no atrophy, and no involuntary movements noted. Tone:    Normal muscle tone.    Posture:    Posture is normal. normal erect    Strength:    Strength is V/V in the upper and lower limbs.      Sensation: intact to LT     Reflex Exam:  DTR's:    Deep tendon reflexes in the upper and lower extremities are normal bilaterally.   Toes:    The toes are downgoing bilaterally.   Clonus:    Clonus is absent.       Assessment/Plan: 35 67 year old patient with chronic migraines.  At this point she is doing excellent on Emgality.  We will continue her current treatment which includes Depakote and Zomig and Emgality.  Discussed: To prevent or relieve headaches, try the following: Cool Compress. Lie down and place a cool compress on your head.  Avoid headache triggers. If certain foods or odors seem to have triggered your migraines in the past, avoid them. A headache diary might help you identify triggers.  Include physical activity in your daily routine. Try a daily walk or other moderate aerobic exercise.  Manage stress. Find  healthy ways to cope with the stressors, such as delegating tasks on your to-do list.  Practice relaxation techniques. Try deep breathing, yoga, massage and visualization.  Eat regularly. Eating regularly scheduled meals and maintaining a healthy diet might help prevent headaches. Also, drink plenty of fluids.  Follow a regular sleep schedule. Sleep deprivation might contribute to headaches Consider biofeedback. With this mind-body technique, you learn to control certain bodily functions - such as muscle tension, heart rate and blood pressure - to prevent headaches or reduce headache pain.    Proceed to emergency room if you experience new or worsening symptoms or symptoms do not resolve, if you have new neurologic symptoms or if headache is severe, or for any concerning symptom.   Provided education and documentation from American headache Society toolbox including articles on: chronic migraine medication overuse headache, chronic migraines, prevention of migraines, behavioral and other nonpharmacologic treatments for headache.   Sarina Ill, MD  [facility] [address] [place], [place] [address]  Phone [phone] Fax [phone]

## 2018-01-18 ENCOUNTER — Encounter: Payer: Self-pay | Admitting: Neurology

## 2018-02-02 ENCOUNTER — Ambulatory Visit: Payer: Medicare HMO

## 2018-02-07 ENCOUNTER — Ambulatory Visit
Admission: RE | Admit: 2018-02-07 | Discharge: 2018-02-07 | Disposition: A | Discharge status: Home / Self Care | Payer: Medicare HMO | Source: Ambulatory Visit | Attending: Obstetrics & Gynecology | Admitting: Obstetrics & Gynecology

## 2018-02-07 DIAGNOSIS — Z1231 Encounter for screening mammogram for malignant neoplasm of breast: Secondary | ICD-10-CM | POA: Diagnosis not present

## 2018-04-19 DIAGNOSIS — M79601 Pain in right arm: Secondary | ICD-10-CM | POA: Diagnosis not present

## 2018-04-24 DIAGNOSIS — M79601 Pain in right arm: Secondary | ICD-10-CM | POA: Diagnosis not present

## 2018-04-27 ENCOUNTER — Telehealth: Payer: Self-pay | Admitting: Neurology

## 2018-04-27 NOTE — Telephone Encounter (Signed)
Pt said Galcanezumab-gnlm (EMGALITY) 120 MG/ML SOAJ has not helped over the past 6 weeks. She is having an increase in HA's and trouble sleeping. Please call to advise. Pt is aware the clinic closes at noon today.

## 2018-04-30 NOTE — Telephone Encounter (Signed)
Patient reported she was doing excellent on Emgality when I saw her I'm not sure what changed nut if she has more headache she needs to be seen can give her a follow up with Patricia Chapman or Patricia Chapman preferrably. Not urgently. Hoyle Sauer has some openings this week thanks

## 2018-04-30 NOTE — Telephone Encounter (Signed)
LVM for pt to schedule f/u with NP, if pt calls back please advised message below

## 2018-05-01 NOTE — Progress Notes (Signed)
[facility]  PATIENT: Patricia Chapman DOB: [birth year]   REASON FOR VISIT: Follow-up for migraine with worsening of headaches HISTORY FROM: Patient alone at visit    [place] 9/25/2019CM [name], 67 year old female returns for follow-up with a history of migraine headaches and recent worsening of her headaches.  When last seen by Dr. Jaynee Eagles in June she was doing well on Emgality.  She decided on her own that she would taper off of her Depakote which was a preventive medication in August and her last dose was in September 8.  Her headaches resumed and so far for the month of September she has had 10 headaches which are migraines.  She took Zomig today with little relief.  She was made aware that many times weekly patients on a preventive medication as well as there medication that they take once a month.  She was taking Depakote 750 mg daily.  Prior to this her headache management had been well controlled and much less severe.  She returns for reevaluation   01/17/18 AA Patricia Chapman is a 67 y.o. female here as a referral from Dr. Hoy Morn for migraines. She was a patient of dr. Earley Favor and then Dr. Melton Alar.  She has had headaches since her early 77s, typical migraines are lateralized severe pounding headaches with associated light noise sensitivity, no nausea, lasting 4 to 72 hours.  Previous triggers were wind perfume bright light stress not sleeping well and weather changes.  She has had 3 shots of Emgality. She has only had 4 headache days last month. In January had 15.  We discussed migraines and migraine management, at this point she appears to be doing well on and reality.  Her migraine frequency has been reduced.  The for headache days last month were migrainous however they were less severe no known inciting events or triggers, they resolve with acute management.  REVIEW OF SYSTEMS: Full 14 system review of systems performed and notable only for those  listed, all others are neg:  Constitutional: neg  Cardiovascular: neg Ear/Nose/Throat: neg  Skin: neg Eyes: neg Respiratory: neg Gastroitestinal: neg  Hematology/Lymphatic: neg  Endocrine: neg Musculoskeletal:neg Allergy/Immunology: neg Neurological: Headache Psychiatric: neg Sleep : Insomnia   ALLERGIES: Allergies  Allergen Reactions  . Penicillins     [place] as a young adult    HOME MEDICATIONS: Outpatient Medications Prior to Visit  Medication Sig Dispense Refill  . divalproex (DEPAKOTE ER) 250 MG 24 hr tablet Take 750 mg by mouth daily.    . Galcanezumab-gnlm (EMGALITY) 120 MG/ML SOAJ Inject 120 mg into the skin every 30 (thirty) days. 1 pen 11  . MYRBETRIQ 50 MG TB24 tablet Take 50 mg by mouth daily.    . naproxen (NAPROSYN) 500 MG tablet Take 1 tablet (500 mg total) by mouth as needed. 30 tablet 11  . predniSONE (DELTASONE) 10 MG tablet Take 10 mg by mouth daily with breakfast. 6 day dose pack take as directed    . zolmitriptan (ZOMIG) 5 MG nasal solution Place 1 spray into the nose as needed for migraine. May repeat in 2 hours. Max 2x one day 6 Units 0  . zolmitriptan (ZOMIG-ZMT) 5 MG disintegrating tablet Take 1 tablet (5 mg total) by mouth as needed. 10 tablet 11  . divalproex (DEPAKOTE ER) 250 MG 24 hr tablet Take 3 tablets (750 mg total) by mouth daily. 270 tablet 4   No facility-administered medications prior to visit.     PAST MEDICAL  HISTORY: Past Medical History:  Diagnosis Date  . Chicken pox   . Migraines     PAST SURGICAL HISTORY: Past Surgical History:  Procedure Laterality Date  . BREAST BIOPSY    . BREAST EXCISIONAL BIOPSY Right 1991   benign  . FINGER SURGERY Left     FAMILY HISTORY: Family History  Problem Relation Age of Onset  . Hyperlipidemia Mother   . Stroke Mother   . Hypertension Mother   . Congestive Heart Failure Mother   . Heart disease Maternal Grandmother   . Kidney disease Maternal Grandfather   . Stroke Father      SOCIAL HISTORY: Social History   Socioeconomic History  . Marital status: Married    Spouse name: Not on file  . Number of children: 2  . Years of education: 16  . Highest education level: Bachelor's degree (e.g., BA, AB, BS)  Occupational History  . Occupation: Part-time for her self-owned print shop  Social Needs  . Financial resource strain: Not on file  . Food insecurity:    Worry: Not on file    Inability: Not on file  . Transportation needs:    Medical: Not on file    Non-medical: Not on file  Tobacco Use  . Smoking status: Never Smoker  . Smokeless tobacco: Never Used  Substance and Sexual Activity  . Alcohol use: No    Frequency: Never  . Drug use: No  . Sexual activity: Never  Lifestyle  . Physical activity:    Days per week: Not on file    Minutes per session: Not on file  . Stress: Not on file  Relationships  . Social connections:    Talks on phone: Not on file    Gets together: Not on file    Attends religious service: Not on file    Active member of club or organization: Not on file    Attends meetings of clubs or organizations: Not on file    Relationship status: Not on file  . Intimate partner violence:    Fear of current or ex partner: Not on file    Emotionally abused: Not on file    Physically abused: Not on file    Forced sexual activity: Not on file  Other Topics Concern  . Not on file  Social History Narrative   Lives at home with her husband.   Right-handed.   No caffeine use.   She is going to be a grandmother December 2019      PHYSICAL EXAM  Vitals:   05/02/18 1339  BP: (!) 104/59  Pulse: 76  Weight: 99 lb (44.9 kg)  Height: 5\' 3"  (1.6 m)   Body mass index is 17.54 kg/m.  Generalized: Well developed, in no acute distress  Head: normocephalic and atraumatic,. Oropharynx benign  Neck: Supple,   Musculoskeletal: No deformity   Neurological examination   Mentation: Alert oriented to time, place, history taking.  Attention span and concentration appropriate. Recent and remote memory intact.  Follows all commands speech and language fluent.   Cranial nerve II-XII: Pupils were equal round reactive to light extraocular movements were full, visual field were full on confrontational test. Facial sensation and strength were normal. hearing was intact to finger rubbing bilaterally. Uvula tongue midline. head turning and shoulder shrug were normal and symmetric.Tongue protrusion into cheek strength was normal. Motor: normal bulk and tone, full strength in the BUE, BLE, fine finger movements normal, no pronator drift. No focal weakness Sensory:  normal and symmetric to light touch, pinprick, and  Vibration, in the upper and lower extremities Coordination: finger-nose-finger, heel-to-shin bilaterally, no dysmetria Reflexes: Brachioradialis 2/2, biceps 2/2, triceps 2/2, patellar 2/2, Achilles 2/2, plantar responses were flexor bilaterally. Gait and Station: Rising up from seated position without assistance, normal stance,  moderate stride, good arm swing, smooth turning, able to perform tiptoe, and heel walking without difficulty. Tandem gait is steady  DIAGNOSTIC DATA (LABS, IMAGING, TESTING) - I reviewed patient records, labs, notes, testing and imaging myself where available.  Lab Results  Component Value Date   WBC 6.4 08/15/2014   HGB 13.8 08/15/2014   HCT 41.9 08/15/2014   MCV 93.3 08/15/2014   PLT 293 08/15/2014      Component Value Date/Time   NA 140 08/15/2014 1634   K 4.1 08/15/2014 1634   CL 102 08/15/2014 1634   CO2 27 08/15/2014 1634   GLUCOSE 85 08/15/2014 1634   BUN 17 08/15/2014 1634   CREATININE 0.88 08/15/2014 1634   CALCIUM 8.9 08/15/2014 1634   PROT 7.1 11/18/2014 1029   ALBUMIN 3.8 11/18/2014 1029   AST 21 11/18/2014 1029   ALT 13 11/18/2014 1029   ALKPHOS 73 11/18/2014 1029   BILITOT 0.4 11/18/2014 1029   Lab Results  Component Value Date   CHOL 258 (H) 11/18/2014   HDL 64.10  11/18/2014   LDLCALC 169 (H) 11/18/2014   TRIG 125.0 11/18/2014   CHOLHDL 4 11/18/2014    Lab Results  Component Value Date   VITAMINB12 719 10/20/2011   Lab Results  Component Value Date   TSH 0.452 08/15/2014      ASSESSMENT AND PLAN 67 year old patient with chronic migraines.  At this point she is doing excellen  When last seen she was doing excellent on Emgality.    She unfortunately decided to taper her Depakote on her own and her last dose was on September 8.  She has had 10 migraines since that time.     PLAN: Restart Depakote 250 mg slow titration until you get to 750 mg Prednisone 6-day Dosepak to break current headache that has been going on for days Continue Emgality monthly Follow-up in 4 to 6 months Given information on common foods that are migraine triggers as well I spent 25 minutes in total face to face time with the patient more than 50% of which was spent counseling and coordination of care, reviewing test results reviewing medications and discussing and reviewing the diagnosis of migraine and further treatment options. , Rayburn Ma, [facility], APRN  [facility] [address] [place], [place] [address] [phone]

## 2018-05-02 ENCOUNTER — Ambulatory Visit: Payer: Medicare HMO | Admitting: Nurse Practitioner

## 2018-05-02 ENCOUNTER — Encounter: Payer: Self-pay | Admitting: Nurse Practitioner

## 2018-05-02 VITALS — BP 104/59 | HR 76 | Ht 63.0 in | Wt 99.0 lb

## 2018-05-02 DIAGNOSIS — IMO0002 Reserved for concepts with insufficient information to code with codable children: Secondary | ICD-10-CM

## 2018-05-02 DIAGNOSIS — G43709 Chronic migraine without aura, not intractable, without status migrainosus: Secondary | ICD-10-CM

## 2018-05-02 MED ORDER — PREDNISONE 10 MG PO TABS: ORAL_TABLET | ORAL | 0 refills | Status: DC

## 2018-05-02 MED ORDER — DIVALPROEX SODIUM ER 250 MG PO TB24: 750.0000 mg | ORAL_TABLET | Freq: Every day | ORAL | 6 refills | Status: DC

## 2018-05-02 NOTE — Patient Instructions (Signed)
Restart Depakote 250 mg slow titration until you get to 750 mg Prednisone 6-day Dosepak to break current headache  ContinueEmgality monthly Follow-up in 4 to 6 months

## 2018-05-02 NOTE — Progress Notes (Signed)
Made any corrections needed, and agree with history, physical, neuro exam,assessment and plan as stated above.     Timothea Bodenheimer, MD [facility] 

## 2018-05-07 DIAGNOSIS — R69 Illness, unspecified: Secondary | ICD-10-CM | POA: Diagnosis not present

## 2018-05-11 DIAGNOSIS — Z23 Encounter for immunization: Secondary | ICD-10-CM | POA: Diagnosis not present

## 2018-05-23 DIAGNOSIS — M816 Localized osteoporosis [Lequesne]: Secondary | ICD-10-CM | POA: Diagnosis not present

## 2018-05-23 DIAGNOSIS — N83291 Other ovarian cyst, right side: Secondary | ICD-10-CM | POA: Diagnosis not present

## 2018-05-23 DIAGNOSIS — Z124 Encounter for screening for malignant neoplasm of cervix: Secondary | ICD-10-CM | POA: Diagnosis not present

## 2018-05-23 DIAGNOSIS — Z681 Body mass index (BMI) 19 or less, adult: Secondary | ICD-10-CM | POA: Diagnosis not present

## 2018-05-23 DIAGNOSIS — N958 Other specified menopausal and perimenopausal disorders: Secondary | ICD-10-CM | POA: Diagnosis not present

## 2018-07-19 ENCOUNTER — Encounter

## 2018-07-19 ENCOUNTER — Ambulatory Visit: Payer: Medicare HMO | Admitting: Neurology

## 2018-07-19 ENCOUNTER — Encounter: Payer: Self-pay | Admitting: Neurology

## 2018-07-19 VITALS — BP 95/59 | HR 83 | Ht 63.0 in | Wt 99.0 lb

## 2018-07-19 DIAGNOSIS — G4700 Insomnia, unspecified: Secondary | ICD-10-CM | POA: Diagnosis not present

## 2018-07-19 DIAGNOSIS — G43709 Chronic migraine without aura, not intractable, without status migrainosus: Secondary | ICD-10-CM

## 2018-07-19 DIAGNOSIS — IMO0002 Reserved for concepts with insufficient information to code with codable children: Secondary | ICD-10-CM

## 2018-07-19 MED ORDER — SUMATRIPTAN SUCCINATE 3 MG/0.5ML ~~LOC~~ SOAJ: 3.0000 mg | Freq: Once | SUBCUTANEOUS | 0 refills | Status: DC | PRN

## 2018-07-19 MED ORDER — SUMATRIPTAN SUCCINATE 3 MG/0.5ML ~~LOC~~ SOAJ: 3.0000 mg | SUBCUTANEOUS | 11 refills | Status: DC

## 2018-07-19 NOTE — Progress Notes (Signed)
[facility]    Provider:  Dr Jaynee Eagles Referring Provider: Keane Scrape* Primary Care Physician:  Jonathon Bellows, PA-C  CC:  Migraines  Interval history 07/19/2018: doing incredibly well. Only 2 migraines in 4 weeks. She does great. The migraines are shorter only last 1-2 days (used to last up to 5 days). She tried to go off of depakote and worsened now back on and better. She takes zomig and naproxen.  Will try Zembrace. She doesn't sleep well. She gets up to urinate. She can;t go back to sleep. She has trouble initiating sleep. She denies racing thoughts. She goes to be 11-12 and sleeps until 9-10. No symptoms Discussed good sleep habits, discussed sleep counseling. Suggested going to be at midnight and waking up at 8, she sleeps until 10.   HPI:  Patricia Chapman is a 67 y.o. female here as a referral from Dr. Hoy Morn for migraines. She was a patient of dr. Earley Favor and then Dr. Melton Alar.  She has had headaches since her early 55s, typical migraines are lateralized severe pounding headaches with associated light noise sensitivity, no nausea, lasting 4 to 72 hours.  Previous triggers were wind perfume bright light stress not sleeping well and weather changes.  She has had 3 shots of Emgality. She has only had 4 headache days last month. In January had 15.  We discussed migraines and migraine management, at this point she appears to be doing well on and reality.  Her migraine frequency has been reduced.  The for headache days last month were migrainous however they were less severe no known inciting events or triggers, they resolve with acute management.  Reviewed notes, labs and imaging from outside physicians, which showed:  Reviewed prior notes.  Patient had headaches for almost 50 years.  She had seen multiple neurologist in the pack.  Typical migraines are unilateral severe pulsating with associated photophobia and phonophobia but without nausea or  vomiting.  Over the year she suffered with severe frequent migraine headaches.  Currently on Depakote.  Last visit with Dr. Krista Blue prior to starting a modality she reported 8 migraine days in a month minimum taking Zomig as needed with breakthrough headaches.  Dr. Krista Blue reviewed notes from Dr. Bobby Rumpf.  She is tried and failed multiple preventative medications in the past including:  Gabapentin, Inderal, imipramine, Topamax (developed rash), amitriptyline, nortriptyline, Effexor, verapamil, protriptyline, Zonegran, Keppra, memantine which caused mood difficulty, could not tolerate magnesium.  Also had no significant benefit with Botox injections x2.  She also had neurostimulator unit, acupuncture, riboflavin, Q enzyme every 10 which have not been helpful.  For abortive treatment she is tried Imitrex, Maxalt, Zomig.  TSH nml in 2016   Review of Systems: Patient complains of symptoms per HPI as well as the following symptoms: migraines. Pertinent negatives and positives per HPI. All others negative.   Social History   Socioeconomic History  . Marital status: Married    Spouse name: Not on file  . Number of children: 2  . Years of education: 16  . Highest education level: Bachelor's degree (e.g., BA, AB, BS)  Occupational History  . Occupation: Part-time for her self-owned print shop  Social Needs  . Financial resource strain: Not on file  . Food insecurity:    Worry: Not on file    Inability: Not on file  . Transportation needs:    Medical: Not on file    Non-medical: Not on file  Tobacco Use  .  Smoking status: Never Smoker  . Smokeless tobacco: Never Used  Substance and Sexual Activity  . Alcohol use: No    Frequency: Never  . Drug use: No  . Sexual activity: Never  Lifestyle  . Physical activity:    Days per week: Not on file    Minutes per session: Not on file  . Stress: Not on file  Relationships  . Social connections:    Talks on phone: Not on file    Gets together: Not on  file    Attends religious service: Not on file    Active member of club or organization: Not on file    Attends meetings of clubs or organizations: Not on file    Relationship status: Not on file  . Intimate partner violence:    Fear of current or ex partner: Not on file    Emotionally abused: Not on file    Physically abused: Not on file    Forced sexual activity: Not on file  Other Topics Concern  . Not on file  Social History Narrative   Lives at home with her husband.   Right-handed.   No caffeine use.   She is going to be a grandmother December 2019     Family History  Problem Relation Age of Onset  . Hyperlipidemia Mother   . Stroke Mother   . Hypertension Mother   . Congestive Heart Failure Mother   . Heart disease Maternal Grandmother   . Kidney disease Maternal Grandfather   . Stroke Father     Past Medical History:  Diagnosis Date  . Chicken pox   . Migraines     Past Surgical History:  Procedure Laterality Date  . BREAST BIOPSY    . BREAST EXCISIONAL BIOPSY Right 1991   benign  . FINGER SURGERY Left     Current Outpatient Medications  Medication Sig Dispense Refill  . CALCIUM PO Take 600 mg by mouth daily.    . Cholecalciferol (VITAMIN D3 PO) Take 5,000 Int'l Units by mouth daily.    . divalproex (DEPAKOTE ER) 250 MG 24 hr tablet Take 3 tablets (750 mg total) by mouth daily. 90 tablet 6  . Galcanezumab-gnlm (EMGALITY) 120 MG/ML SOAJ Inject 120 mg into the skin every 30 (thirty) days. 1 pen 11  . mirabegron ER (MYRBETRIQ) 25 MG TB24 tablet Take 25 mg by mouth daily.    . naproxen (NAPROSYN) 500 MG tablet Take 1 tablet (500 mg total) by mouth as needed. 30 tablet 11  . vitamin C (ASCORBIC ACID) 250 MG tablet Take 250 mg by mouth daily.    Marland Kitchen zolmitriptan (ZOMIG-ZMT) 5 MG disintegrating tablet Take 1 tablet (5 mg total) by mouth as needed. 10 tablet 11  . SUMAtriptan Succinate (ZEMBRACE SYMTOUCH) 3 MG/0.5ML SOAJ Inject 3 mg into the skin once as needed for  up to 1 dose. May repeat in 15 minutes. If symptoms persist, repeat in 2 hours. Max 4 injections daily. 1 pen 0  . SUMAtriptan Succinate (ZEMBRACE SYMTOUCH) 3 MG/0.5ML SOAJ Inject 3 mg into the skin every 15 (fifteen) minutes for 1 dose. May repeat in 15 minutes.  Max 4 injections daily. 8 pen 11  . zolmitriptan (ZOMIG) 5 MG nasal solution Place 1 spray into the nose as needed for migraine. May repeat in 2 hours. Max 2x one day (Patient not taking: Reported on 07/19/2018) 6 Units 0   No current facility-administered medications for this visit.     Allergies as of  07/19/2018 - Review Complete 07/19/2018  Allergen Reaction Noted  . Penicillins  07/13/2010    Vitals: BP (!) 95/59 (BP Location: Right Arm, Patient Position: Sitting)   Pulse 83   Ht 5\' 3"  (1.6 m)   Wt 99 lb (44.9 kg)   BMI 17.54 kg/m  Last Weight:  Wt Readings from Last 1 Encounters:  07/19/18 99 lb (44.9 kg)   Last Height:   Ht Readings from Last 1 Encounters:  07/19/18 5\' 3"  (1.6 m)   Physical exam: Exam: Gen: NAD, conversant, well nourised, well groomed                     CV: RRR, no MRG. No Carotid Bruits. No peripheral edema, warm, nontender Eyes: Conjunctivae clear without exudates or hemorrhage  Neuro: Detailed Neurologic Exam  Speech:    Speech is normal; fluent and spontaneous with normal comprehension.  Cognition:    The patient is oriented to person, place, and time;     recent and remote memory intact;     language fluent;     normal attention, concentration,     fund of knowledge Cranial Nerves:    The pupils are equal, round, and reactive to light. The fundi are normal and spontaneous venous pulsations are present. Visual fields are full to finger confrontation. Extraocular movements are intact. Trigeminal sensation is intact and the muscles of mastication are normal. The face is symmetric. The palate elevates in the midline. Hearing intact. Voice is normal. Shoulder shrug is normal. The tongue  has normal motion without fasciculations.   Coordination:    Normal finger to nose and heel to shin. Normal rapid alternating movements.   Gait:    Heel-toe and tandem gait are normal.   Motor Observation:    No asymmetry, no atrophy, and no involuntary movements noted. Tone:    Normal muscle tone.    Posture:    Posture is normal. normal erect    Strength:    Strength is V/V in the upper and lower limbs.      Sensation: intact to LT     Reflex Exam:  DTR's:    Deep tendon reflexes in the upper and lower extremities are normal bilaterally.   Toes:    The toes are downgoing bilaterally.   Clonus:    Clonus is absent.       Assessment/Plan: [age] year old patient with chronic migraines.  At this point she is doing excellent on Emgality.  We will continue her current treatment which includes Depakote and Zomig and Emgality.  She did well in the past on imitrex injections but will try 3mg  Zembrace due to her age a lower dose may be more prudent.   Meds ordered this encounter  Medications  . SUMAtriptan Succinate (ZEMBRACE SYMTOUCH) 3 MG/0.5ML SOAJ    Sig: Inject 3 mg into the skin once as needed for up to 1 dose. May repeat in 15 minutes. If symptoms persist, repeat in 2 hours. Max 4 injections daily.    Dispense:  1 pen    Refill:  0  . SUMAtriptan Succinate (ZEMBRACE SYMTOUCH) 3 MG/0.5ML SOAJ    Sig: Inject 3 mg into the skin every 15 (fifteen) minutes for 1 dose. May repeat in 15 minutes.  Max 4 injections daily.    Dispense:  8 pen    Refill:  11   Continue current meds Discussed insomnia, good sleep techniques.   Discussed: To prevent or relieve headaches, try the  following: Cool Compress. Lie down and place a cool compress on your head.  Avoid headache triggers. If certain foods or odors seem to have triggered your migraines in the past, avoid them. A headache diary might help you identify triggers.  Include physical activity in your daily routine. Try a  daily walk or other moderate aerobic exercise.  Manage stress. Find healthy ways to cope with the stressors, such as delegating tasks on your to-do list.  Practice relaxation techniques. Try deep breathing, yoga, massage and visualization.  Eat regularly. Eating regularly scheduled meals and maintaining a healthy diet might help prevent headaches. Also, drink plenty of fluids.  Follow a regular sleep schedule. Sleep deprivation might contribute to headaches Consider biofeedback. With this mind-body technique, you learn to control certain bodily functions - such as muscle tension, heart rate and blood pressure - to prevent headaches or reduce headache pain.    Proceed to emergency room if you experience new or worsening symptoms or symptoms do not resolve, if you have new neurologic symptoms or if headache is severe, or for any concerning symptom.   Provided education and documentation from American headache Society toolbox including articles on: chronic migraine medication overuse headache, chronic migraines, prevention of migraines, behavioral and other nonpharmacologic treatments for headache.   Sarina Ill, MD   [facility] [address] [place], [place] [address]  Phone [phone] Fax [phone]

## 2018-07-19 NOTE — Patient Instructions (Addendum)
congitive behavioral therapy with a psychologist, we sent to [facility] for insomnia

## 2018-07-24 DIAGNOSIS — R636 Underweight: Secondary | ICD-10-CM | POA: Insufficient documentation

## 2018-07-24 DIAGNOSIS — M810 Age-related osteoporosis without current pathological fracture: Secondary | ICD-10-CM | POA: Insufficient documentation

## 2018-07-24 DIAGNOSIS — E785 Hyperlipidemia, unspecified: Secondary | ICD-10-CM | POA: Diagnosis not present

## 2018-07-24 DIAGNOSIS — E042 Nontoxic multinodular goiter: Secondary | ICD-10-CM | POA: Diagnosis not present

## 2018-08-06 ENCOUNTER — Telehealth: Payer: Self-pay | Admitting: *Deleted

## 2018-08-06 NOTE — Telephone Encounter (Signed)
Completed Zembrace 3 mg PA on Cover My Meds. KEY: [id number].    "If [employer] D has not replied to your request within 24 hours please contact [employer] at [phone]."

## 2018-08-09 NOTE — Telephone Encounter (Signed)
Zembrace symtouch 3 mg has been approved by [employer] from 08/06/2018 through 08/08/2019. Faxed approval letter to [employer]. Received a receipt of confirmation.

## 2018-08-13 ENCOUNTER — Telehealth: Payer: Self-pay | Admitting: *Deleted

## 2018-08-13 NOTE — Telephone Encounter (Signed)
PA for Emgality 120mg  SQ Q30 days completed via Cover My Meds. Key# [id number]. Dx: Chronic migraine (G43.709). Tried and failed meds: Depakote, Depakote ER, Naproxen, Ajovy, Keppra, Gabapentin, Inderal, Imipramine, Topamax, Nortriptyline, Amitriptyline, Protriptyline, Verapamil, Effexor, Prednisone, Imitrex, Zomig, Maxalt, Magnesium, CoQ10, Riboflavin, Accupuncture, Neurostimulator unit/fim

## 2018-08-13 NOTE — Telephone Encounter (Signed)
Fax received from [facility], phone# [phone]. Emgality PA approved for dates 08/06/18-08/08/2019. Member ID# [id number]. Referral# [id number]

## 2018-08-15 DIAGNOSIS — H524 Presbyopia: Secondary | ICD-10-CM | POA: Diagnosis not present

## 2018-08-15 DIAGNOSIS — D3131 Benign neoplasm of right choroid: Secondary | ICD-10-CM | POA: Diagnosis not present

## 2018-08-15 DIAGNOSIS — H2513 Age-related nuclear cataract, bilateral: Secondary | ICD-10-CM | POA: Diagnosis not present

## 2018-08-15 DIAGNOSIS — H0014 Chalazion left upper eyelid: Secondary | ICD-10-CM | POA: Diagnosis not present

## 2018-08-29 DIAGNOSIS — E785 Hyperlipidemia, unspecified: Secondary | ICD-10-CM | POA: Diagnosis not present

## 2018-08-29 DIAGNOSIS — E042 Nontoxic multinodular goiter: Secondary | ICD-10-CM | POA: Diagnosis not present

## 2018-09-24 DIAGNOSIS — M79601 Pain in right arm: Secondary | ICD-10-CM | POA: Diagnosis not present

## 2018-09-24 DIAGNOSIS — M542 Cervicalgia: Secondary | ICD-10-CM | POA: Diagnosis not present

## 2018-11-03 ENCOUNTER — Other Ambulatory Visit: Payer: Self-pay | Admitting: Neurology

## 2018-12-03 ENCOUNTER — Other Ambulatory Visit: Payer: Self-pay | Admitting: *Deleted

## 2018-12-03 MED ORDER — DIVALPROEX SODIUM ER 250 MG PO TB24: 750.0000 mg | ORAL_TABLET | Freq: Every day | ORAL | 6 refills | Status: DC

## 2019-01-16 ENCOUNTER — Telehealth: Payer: Self-pay | Admitting: *Deleted

## 2019-01-16 NOTE — Telephone Encounter (Signed)
Pt r/s for December.

## 2019-01-16 NOTE — Telephone Encounter (Signed)
Called pt & LVM asking for call back. I advised d/t slight schedule adjustment we would like to switch her to Amy NP who currently has same appt for 01/21/2019 2 pm or if pt is doing well and doesn't feel she needs appt, can r/s to a year out from last appt (per Dr. Jaynee Eagles). Left office number in message.

## 2019-01-16 NOTE — Telephone Encounter (Signed)
Noted thanks °

## 2019-01-21 ENCOUNTER — Ambulatory Visit: Payer: Medicare HMO | Admitting: Neurology

## 2019-02-05 ENCOUNTER — Other Ambulatory Visit: Payer: Self-pay | Admitting: Obstetrics & Gynecology

## 2019-02-05 DIAGNOSIS — Z1231 Encounter for screening mammogram for malignant neoplasm of breast: Secondary | ICD-10-CM

## 2019-03-20 ENCOUNTER — Ambulatory Visit
Admission: RE | Admit: 2019-03-20 | Discharge: 2019-03-20 | Disposition: A | Discharge status: Home / Self Care | Payer: Medicare HMO | Source: Ambulatory Visit | Attending: Obstetrics & Gynecology | Admitting: Obstetrics & Gynecology

## 2019-03-20 ENCOUNTER — Other Ambulatory Visit: Payer: Self-pay

## 2019-03-20 DIAGNOSIS — Z1231 Encounter for screening mammogram for malignant neoplasm of breast: Secondary | ICD-10-CM | POA: Diagnosis not present

## 2019-03-24 ENCOUNTER — Other Ambulatory Visit: Payer: Self-pay | Admitting: Neurology

## 2019-05-18 DIAGNOSIS — R69 Illness, unspecified: Secondary | ICD-10-CM | POA: Diagnosis not present

## 2019-05-27 DIAGNOSIS — Z01419 Encounter for gynecological examination (general) (routine) without abnormal findings: Secondary | ICD-10-CM | POA: Diagnosis not present

## 2019-05-27 DIAGNOSIS — Z681 Body mass index (BMI) 19 or less, adult: Secondary | ICD-10-CM | POA: Diagnosis not present

## 2019-05-27 DIAGNOSIS — R350 Frequency of micturition: Secondary | ICD-10-CM | POA: Diagnosis not present

## 2019-05-28 ENCOUNTER — Telehealth: Payer: Self-pay | Admitting: *Deleted

## 2019-05-28 NOTE — Telephone Encounter (Signed)
Called pt & LVM (ok per DPR) advising Dr. Jaynee Eagles out of office on 12/14. Advised we need to r/s her appt. Asked for a call back to r/s. Also let her know for a routine f/u we can have her see the NP Amy.

## 2019-05-30 ENCOUNTER — Other Ambulatory Visit: Payer: Self-pay | Admitting: Neurology

## 2019-06-26 ENCOUNTER — Telehealth: Payer: Self-pay | Admitting: *Deleted

## 2019-06-26 NOTE — Telephone Encounter (Signed)
We received a notice from [place] stating pt's Emgality is not covered in 2021. Preferred medications are Aimovig or other covered alternatives (not specified on form).

## 2019-06-26 NOTE — Telephone Encounter (Signed)
Sure! Will discuss.

## 2019-07-10 ENCOUNTER — Ambulatory Visit: Payer: Medicare HMO | Admitting: Family Medicine

## 2019-07-10 ENCOUNTER — Encounter: Payer: Self-pay | Admitting: Family Medicine

## 2019-07-10 ENCOUNTER — Other Ambulatory Visit: Payer: Self-pay

## 2019-07-10 VITALS — BP 100/70 | HR 75 | Temp 84.0°F | Ht 63.0 in | Wt 99.0 lb

## 2019-07-10 DIAGNOSIS — G43709 Chronic migraine without aura, not intractable, without status migrainosus: Secondary | ICD-10-CM

## 2019-07-10 DIAGNOSIS — R4189 Other symptoms and signs involving cognitive functions and awareness: Secondary | ICD-10-CM

## 2019-07-10 DIAGNOSIS — IMO0002 Reserved for concepts with insufficient information to code with codable children: Secondary | ICD-10-CM

## 2019-07-10 MED ORDER — ZEMBRACE SYMTOUCH 3 MG/0.5ML ~~LOC~~ SOAJ: 3.0000 mg | SUBCUTANEOUS | 11 refills | Status: DC

## 2019-07-10 MED ORDER — ZOLMITRIPTAN 5 MG PO TBDP: ORAL_TABLET | ORAL | 4 refills | Status: DC

## 2019-07-10 MED ORDER — DIVALPROEX SODIUM ER 250 MG PO TB24: 750.0000 mg | ORAL_TABLET | Freq: Every day | ORAL | 3 refills | Status: DC

## 2019-07-10 NOTE — Patient Instructions (Signed)
We will continue Emgality and Depakote as prescribed  Continue Zembrace or Zomig as needed for abortive care (do not use either consistently)  Follow up with me in 1 year, sooner if needed    Migraine Headache A migraine headache is an intense, throbbing pain on one side or both sides of the head. Migraine headaches may also cause other symptoms, such as nausea, vomiting, and sensitivity to light and noise. A migraine headache can last from 4 hours to 3 days. Talk with your doctor about what things may bring on (trigger) your migraine headaches. What are the causes? The exact cause of this condition is not known. However, a migraine may be caused when nerves in the brain become irritated and release chemicals that cause inflammation of blood vessels. This inflammation causes pain. This condition may be triggered or caused by:  Drinking alcohol.  Smoking.  Taking medicines, such as: ? Medicine used to treat chest pain (nitroglycerin). ? Birth control pills. ? Estrogen. ? Certain blood pressure medicines.  Eating or drinking products that contain nitrates, glutamate, aspartame, or tyramine. Aged cheeses, chocolate, or caffeine may also be triggers.  Doing physical activity. Other things that may trigger a migraine headache include:  Menstruation.  Pregnancy.  Hunger.  Stress.  Lack of sleep or too much sleep.  Weather changes.  Fatigue. What increases the risk? The following factors may make you more likely to experience migraine headaches:  Being a certain age. This condition is more common in people who are 70-3 years old.  Being female.  Having a family history of migraine headaches.  Being Caucasian.  Having a mental health condition, such as depression or anxiety.  Being obese. What are the signs or symptoms? The main symptom of this condition is pulsating or throbbing pain. This pain may:  Happen in any area of the head, such as on one side or both sides.   Interfere with daily activities.  Get worse with physical activity.  Get worse with exposure to bright lights or loud noises. Other symptoms may include:  Nausea.  Vomiting.  Dizziness.  General sensitivity to bright lights, loud noises, or smells. Before you get a migraine headache, you may get warning signs (an aura). An aura may include:  Seeing flashing lights or having blind spots.  Seeing bright spots, halos, or zigzag lines.  Having tunnel vision or blurred vision.  Having numbness or a tingling feeling.  Having trouble talking.  Having muscle weakness. Some people have symptoms after a migraine headache (postdromal phase), such as:  Feeling tired.  Difficulty concentrating. How is this diagnosed? A migraine headache can be diagnosed based on:  Your symptoms.  A physical exam.  Tests, such as: ? CT scan or an MRI of the head. These imaging tests can help rule out other causes of headaches. ? Taking fluid from the spine (lumbar puncture) and analyzing it (cerebrospinal fluid analysis, or CSF analysis). How is this treated? This condition may be treated with medicines that:  Relieve pain.  Relieve nausea.  Prevent migraine headaches. Treatment for this condition may also include:  Acupuncture.  Lifestyle changes like avoiding foods that trigger migraine headaches.  Biofeedback.  Cognitive behavioral therapy. Follow these instructions at home: Medicines  Take over-the-counter and prescription medicines only as told by your health care provider.  Ask your health care provider if the medicine prescribed to you: ? Requires you to avoid driving or using heavy machinery. ? Can cause constipation. You may need to take  these actions to prevent or treat constipation:  Drink enough fluid to keep your urine pale yellow.  Take over-the-counter or prescription medicines.  Eat foods that are high in fiber, such as beans, whole grains, and fresh fruits  and vegetables.  Limit foods that are high in fat and processed sugars, such as fried or sweet foods. Lifestyle  Do not drink alcohol.  Do not use any products that contain nicotine or tobacco, such as cigarettes, e-cigarettes, and chewing tobacco. If you need help quitting, ask your health care provider.  Get at least 8 hours of sleep every night.  Find ways to manage stress, such as meditation, deep breathing, or yoga. General instructions      Keep a journal to find out what may trigger your migraine headaches. For example, write down: ? What you eat and drink. ? How much sleep you get. ? Any change to your diet or medicines.  If you have a migraine headache: ? Avoid things that make your symptoms worse, such as bright lights. ? It may help to lie down in a dark, quiet room. ? Do not drive or use heavy machinery. ? Ask your health care provider what activities are safe for you while you are experiencing symptoms.  Keep all follow-up visits as told by your health care provider. This is important. Contact a health care provider if:  You develop symptoms that are different or more severe than your usual migraine headache symptoms.  You have more than 15 headache days in one month. Get help right away if:  Your migraine headache becomes severe.  Your migraine headache lasts longer than 72 hours.  You have a fever.  You have a stiff neck.  You have vision loss.  Your muscles feel weak or like you cannot control them.  You start to lose your balance often.  You have trouble walking.  You faint.  You have a seizure. Summary  A migraine headache is an intense, throbbing pain on one side or both sides of the head. Migraines may also cause other symptoms, such as nausea, vomiting, and sensitivity to light and noise.  This condition may be treated with medicines and lifestyle changes. You may also need to avoid certain things that trigger a migraine headache.   Keep a journal to find out what may trigger your migraine headaches.  Contact your health care provider if you have more than 15 headache days in a month or you develop symptoms that are different or more severe than your usual migraine headache symptoms. This information is not intended to replace advice given to you by your health care provider. Make sure you discuss any questions you have with your health care provider. Document Released: 07/25/2005 Document Revised: 11/16/2018 Document Reviewed: 09/06/2018 Elsevier Patient Education  2020 [employer].

## 2019-07-10 NOTE — Progress Notes (Addendum)
PATIENT: Patricia Chapman DOB: [birth year]  REASON FOR VISIT: follow up HISTORY FROM: patient  Chief Complaint  Patient presents with  . Follow-up    Room 16 , alone. 4 migraine last month     HISTORY OF PRESENT ILLNESS: Today 07/10/19 Patricia Chapman is a 68 y.o. female here today for follow up for migraines. She continues Emgality monthly, divalproex ER 750mg  daily and Zembrace or Zomig for abortive therapy. She has tried to wean divalproex but reports a significant worsening of migraines with lower doses. She denies obvious adverse effects with medications. On average she may have 3-4 migraines one month and none the next. Overall she feels that she is doing very well. She has CPE scheduled with PCP this month. She does continue to note short term memory loss. She describes concerns of forgetting if she has cleaned something, hard time recalling names at times, forgetting why she walked into a room. She is able to perform all ADL's. She is driving without difficulty. She is able to dose her medications appropriately and is very clear of what she is taking. No family history of neurodegenerative disease.   HISTORY: (copied from Dr Cathren Laine note on 07/19/2018)  Interval history 07/19/2018: doing incredibly well. Only 2 migraines in 4 weeks. She does great. The migraines are shorter only last 1-2 days (used to last up to 5 days). She tried to go off of depakote and worsened now back on and better. She takes zomig and naproxen.  Will try Zembrace. She doesn't sleep well. She gets up to urinate. She can;t go back to sleep. She has trouble initiating sleep. She denies racing thoughts. She goes to be 11-12 and sleeps until 9-10. No symptoms Discussed good sleep habits, discussed sleep counseling. Suggested going to be at midnight and waking up at 8, she sleeps until 10.   HPI:  Patricia Chapman is a 68 y.o. female here as a referral from Dr. Hoy Morn for migraines. She was a patient of dr. Earley Favor and  then Dr. Melton Alar.  She has had headaches since her early 47s, typical migraines are lateralized severe pounding headaches with associated light noise sensitivity, no nausea, lasting 4 to 72 hours.  Previous triggers were wind perfume bright light stress not sleeping well and weather changes.  She has had 3 shots of Emgality. She has only had 4 headache days last month. In January had 15.  We discussed migraines and migraine management, at this point she appears to be doing well on and reality.  Her migraine frequency has been reduced.  The for headache days last month were migrainous however they were less severe no known inciting events or triggers, they resolve with acute management.  Reviewed notes, labs and imaging from outside physicians, which showed:  Reviewed prior notes.  Patient had headaches for almost 50 years.  She had seen multiple neurologist in the [place].  Typical migraines are unilateral severe pulsating with associated photophobia and phonophobia but without nausea or vomiting.  Over the year she suffered with severe frequent migraine headaches.  Currently on Depakote.  Last visit with Dr. Krista Blue prior to starting a modality she reported 8 migraine days in a month minimum taking Zomig as needed with breakthrough headaches.  Dr. Krista Blue reviewed notes from Dr. Bobby Rumpf.  She is tried and failed multiple preventative medications in the past including:  Gabapentin, Inderal, imipramine, Topamax (developed rash), amitriptyline, nortriptyline, Effexor, verapamil, protriptyline, Zonegran, Keppra, memantine which caused mood difficulty, could not  tolerate magnesium.  Also had no significant benefit with Botox injections x2.  She also had neurostimulator unit, acupuncture, riboflavin, Q enzyme every 10 which have not been helpful.  For abortive treatment she is tried Imitrex, Maxalt, Zomig.  TSH nml in 2016   REVIEW OF SYSTEMS: Out of a complete 14 system review of symptoms, the patient complains only of  the following symptoms, headaches, memory loss and all other reviewed systems are negative.   ALLERGIES: Allergies  Allergen Reactions  . Penicillins     [place] as a young adult    HOME MEDICATIONS: Outpatient Medications Prior to Visit  Medication Sig Dispense Refill  . CALCIUM PO Take 600 mg by mouth daily.    . Cholecalciferol (VITAMIN D3 PO) Take 5,000 Int'l Units by mouth daily.    Marland Kitchen EMGALITY 120 MG/ML SOAJ INJECT 120MG  INTO THE SKIN EVERY 30 DAYS 1 pen 11  . mirabegron ER (MYRBETRIQ) 25 MG TB24 tablet Take 25 mg by mouth daily.    . divalproex (DEPAKOTE ER) 250 MG 24 hr tablet Take 3 tablets (750 mg total) by mouth daily. 90 tablet 6  . zolmitriptan (ZOMIG-ZMT) 5 MG disintegrating tablet DISSOLVE ONE TABLET BY MOUTH  AS NEEDED 6 tablet 4  . vitamin C (ASCORBIC ACID) 250 MG tablet Take 250 mg by mouth daily.    . naproxen (NAPROSYN) 500 MG tablet Take 1 tablet (500 mg total) by mouth as needed. 30 tablet 11  . SUMAtriptan Succinate (ZEMBRACE SYMTOUCH) 3 MG/0.5ML SOAJ Inject 3 mg into the skin once as needed for up to 1 dose. May repeat in 15 minutes. If symptoms persist, repeat in 2 hours. Max 4 injections daily. (Patient not taking: Reported on 07/10/2019) 1 pen 0  . SUMAtriptan Succinate (ZEMBRACE SYMTOUCH) 3 MG/0.5ML SOAJ Inject 3 mg into the skin every 15 (fifteen) minutes for 1 dose. May repeat in 15 minutes.  Max 4 injections daily. 8 pen 11  . zolmitriptan (ZOMIG) 5 MG nasal solution Place 1 spray into the nose as needed for migraine. May repeat in 2 hours. Max 2x one day (Patient not taking: Reported on 07/19/2018) 6 Units 0   No facility-administered medications prior to visit.     PAST MEDICAL HISTORY: Past Medical History:  Diagnosis Date  . Chicken pox   . Migraines     PAST SURGICAL HISTORY: Past Surgical History:  Procedure Laterality Date  . BREAST BIOPSY    . BREAST EXCISIONAL BIOPSY Right 1991   benign  . FINGER SURGERY Left     FAMILY HISTORY: Family  History  Problem Relation Age of Onset  . Hyperlipidemia Mother   . Stroke Mother   . Hypertension Mother   . Congestive Heart Failure Mother   . Heart disease Maternal Grandmother   . Kidney disease Maternal Grandfather   . Stroke Father     SOCIAL HISTORY: Social History   Socioeconomic History  . Marital status: Married    Spouse name: Not on file  . Number of children: 2  . Years of education: 16  . Highest education level: Bachelor's degree (e.g., BA, AB, BS)  Occupational History  . Occupation: Part-time for her self-owned print shop  Social Needs  . Financial resource strain: Not on file  . Food insecurity    Worry: Not on file    Inability: Not on file  . Transportation needs    Medical: Not on file    Non-medical: Not on file  Tobacco Use  .  Smoking status: Never Smoker  . Smokeless tobacco: Never Used  Substance and Sexual Activity  . Alcohol use: No    Frequency: Never  . Drug use: No  . Sexual activity: Never  Lifestyle  . Physical activity    Days per week: Not on file    Minutes per session: Not on file  . Stress: Not on file  Relationships  . Social Herbalist on phone: Not on file    Gets together: Not on file    Attends religious service: Not on file    Active member of club or organization: Not on file    Attends meetings of clubs or organizations: Not on file    Relationship status: Not on file  . Intimate partner violence    Fear of current or ex partner: Not on file    Emotionally abused: Not on file    Physically abused: Not on file    Forced sexual activity: Not on file  Other Topics Concern  . Not on file  Social History Narrative   Lives at home with her husband.   Right-handed.   No caffeine use.   She is going to be a grandmother December 2019       PHYSICAL EXAM  Vitals:   07/10/19 1500  BP: 100/70  Pulse: 75  Temp: (!) 84 F (28.9 C)  Weight: 99 lb (44.9 kg)  Height: 5\' 3"  (1.6 m)   Body mass index is  17.54 kg/m.  Generalized: Well developed, in no acute distress  Cardiology: normal rate and rhythm, no murmur noted Neurological examination  Mentation: Alert oriented to time, place, history taking. Follows all commands speech and language fluent Cranial nerve II-XII: Pupils were equal round reactive to light. Extraocular movements were full, visual field were full on confrontational test. Facial sensation and strength were normal. Uvula tongue midline. Head turning and shoulder shrug  were normal and symmetric. Motor: The motor testing reveals 5 over 5 strength of all 4 extremities. Good symmetric motor tone is noted throughout.  Sensory: Sensory testing is intact to soft touch on all 4 extremities. No evidence of extinction is noted.  Coordination: Cerebellar testing reveals good finger-nose-finger and heel-to-shin bilaterally.  Gait and station: Gait is normal.    DIAGNOSTIC DATA (LABS, IMAGING, TESTING) - I reviewed patient records, labs, notes, testing and imaging myself where available.  No flowsheet data found.   Lab Results  Component Value Date   WBC 6.4 08/15/2014   HGB 13.8 08/15/2014   HCT 41.9 08/15/2014   MCV 93.3 08/15/2014   PLT 293 08/15/2014      Component Value Date/Time   NA 140 08/15/2014 1634   K 4.1 08/15/2014 1634   CL 102 08/15/2014 1634   CO2 27 08/15/2014 1634   GLUCOSE 85 08/15/2014 1634   BUN 17 08/15/2014 1634   CREATININE 0.88 08/15/2014 1634   CALCIUM 8.9 08/15/2014 1634   PROT 7.1 11/18/2014 1029   ALBUMIN 3.8 11/18/2014 1029   AST 21 11/18/2014 1029   ALT 13 11/18/2014 1029   ALKPHOS 73 11/18/2014 1029   BILITOT 0.4 11/18/2014 1029   Lab Results  Component Value Date   CHOL 258 (H) 11/18/2014   HDL 64.10 11/18/2014   LDLCALC 169 (H) 11/18/2014   TRIG 125.0 11/18/2014   CHOLHDL 4 11/18/2014   No results found for: HGBA1C Lab Results  Component Value Date   VITAMINB12 719 10/20/2011   Lab Results  Component  Value Date   TSH  0.452 08/15/2014       ASSESSMENT AND PLAN 68 y.o. year old female  has a past medical history of Chicken pox and Migraines. here with     ICD-10-CM   1. Chronic migraine  G43.709 CMP    Valproic Acid Level  2. Subjective memory complaints  R41.89     Mickel is doing very well from a migraine perspective.  She is tolerating Emgality and divalproex as prescribed without obvious adverse effects.  She has tried weaning divalproex in the past but reports migraines worsen.  She will continue current therapy.  She may use embrace or Zomig for abortive care.  She was advised not to use either medication consistently.  We have discussed memory concerns.  Patient is completely oriented in the office.  With lengthy conversation regarding medical history and current medication regimen, patient is completely appropriate.  She is able to recall information requested in the room.  We have discussed many potential concerns of memory loss.  I am not suspicious of a neurodegenerative cause of memory loss at this time.  I have recommended that she present concerns to PCP to assess for any metabolic causes of brain fog.  She has not had lab work in about 2 years.  I will update valproic acid levels today.  She will follow-up with me in 1 year, sooner if needed.  She verbalizes understanding and agreement with this plan.   Orders Placed This Encounter  Procedures  . CMP  . Valproic Acid Level     Meds ordered this encounter  Medications  . SUMAtriptan Succinate (ZEMBRACE SYMTOUCH) 3 MG/0.5ML SOAJ    Sig: Inject 3 mg into the skin every 15 (fifteen) minutes for 1 dose. May repeat in 15 minutes.  Max 4 injections daily.    Dispense:  8 pen    Refill:  11    Order Specific Question:   Supervising Provider    Answer:   Melvenia Beam [id number]  . zolmitriptan (ZOMIG-ZMT) 5 MG disintegrating tablet    Sig: DISSOLVE ONE TABLET BY MOUTH  AS NEEDED    Dispense:  6 tablet    Refill:  4    Order Specific  Question:   Supervising Provider    Answer:   Melvenia Beam [id number]  . divalproex (DEPAKOTE ER) 250 MG 24 hr tablet    Sig: Take 3 tablets (750 mg total) by mouth daily.    Dispense:  270 tablet    Refill:  3    Order Specific Question:   Supervising Provider    Answer:   Melvenia Beam [id number]      I spent 15 minutes with the patient. 50% of this time was spent counseling and educating patient on plan of care and medications.    Debbora Presto, FNP-C 07/10/2019, 4:01 PM [facility] [address] [place], [place] [address] [phone]  Made any corrections needed, and agree with history, physical, neuro exam,assessment and plan as stated.     Sarina Ill, MD [facility]

## 2019-07-11 LAB — COMPREHENSIVE METABOLIC PANEL
ALT: 10 IU/L (ref 0–32)
AST: 16 IU/L (ref 0–40)
Albumin/Globulin Ratio: 1.4 (ref 1.2–2.2)
Albumin: 3.8 g/dL (ref 3.8–4.8)
Alkaline Phosphatase: 62 IU/L (ref 39–117)
BUN/Creatinine Ratio: 27 (ref 12–28)
BUN: 26 mg/dL (ref 8–27)
Bilirubin Total: <0.2 mg/dL (ref 0.0–1.2)
CO2: 30 mmol/L — ABNORMAL HIGH (ref 20–29)
Calcium: 9.4 mg/dL (ref 8.7–10.3)
Chloride: 102 mmol/L (ref 96–106)
Creatinine, Ser: 0.95 mg/dL (ref 0.57–1.00)
GFR calc Af Amer: 71 mL/min/{1.73_m2} (ref >59)
GFR calc non Af Amer: 61 mL/min/{1.73_m2} (ref >59)
Globulin, Total: 2.7 g/dL (ref 1.5–4.5)
Glucose: 89 mg/dL (ref 65–99)
Potassium: 4.5 mmol/L (ref 3.5–5.2)
Sodium: 143 mmol/L (ref 134–144)
Total Protein: 6.5 g/dL (ref 6.0–8.5)

## 2019-07-11 LAB — VALPROIC ACID LEVEL: Valproic Acid Lvl: 84 ug/mL (ref 50–100)

## 2019-07-15 ENCOUNTER — Telehealth: Payer: Self-pay

## 2019-07-15 NOTE — Telephone Encounter (Signed)
Called on with results on recent labs on behalf of NP Amy Lomax.  Pt demonstrated understanding and had no further questions regarding her results.   CMP: "Notes recorded by Debbora Presto, NP on 07/11/2019 at 10:30 AM EST  Labs are normal" -NP AL  Valproic acid: "Notes recorded by Debbora Presto, NP on 07/11/2019 at 10:30 AM EST  Labs are normal" -NP AL

## 2019-07-22 ENCOUNTER — Ambulatory Visit: Payer: Medicare HMO | Admitting: Neurology

## 2019-08-20 DIAGNOSIS — H524 Presbyopia: Secondary | ICD-10-CM | POA: Diagnosis not present

## 2019-08-20 DIAGNOSIS — H2513 Age-related nuclear cataract, bilateral: Secondary | ICD-10-CM | POA: Diagnosis not present

## 2019-08-20 DIAGNOSIS — H5213 Myopia, bilateral: Secondary | ICD-10-CM | POA: Diagnosis not present

## 2019-08-20 DIAGNOSIS — H04123 Dry eye syndrome of bilateral lacrimal glands: Secondary | ICD-10-CM | POA: Diagnosis not present

## 2019-09-05 DIAGNOSIS — Z20828 Contact with and (suspected) exposure to other viral communicable diseases: Secondary | ICD-10-CM | POA: Diagnosis not present

## 2019-09-05 DIAGNOSIS — Z20822 Contact with and (suspected) exposure to covid-19: Secondary | ICD-10-CM | POA: Diagnosis not present

## 2019-11-04 ENCOUNTER — Other Ambulatory Visit: Payer: Self-pay | Admitting: Neurology

## 2019-12-13 ENCOUNTER — Telehealth: Payer: Self-pay | Admitting: Family Medicine

## 2019-12-13 NOTE — Telephone Encounter (Signed)
Pt called wanting to know why her SUMAtriptan Succinate (ZEMBRACE SYMTOUCH) 3 MG/0.5ML SOAJ(Expired) has been denied Please advise.

## 2019-12-16 MED ORDER — ZEMBRACE SYMTOUCH 3 MG/0.5ML ~~LOC~~ SOAJ: 3.0000 mg | SUBCUTANEOUS | 5 refills | Status: DC

## 2019-12-16 NOTE — Telephone Encounter (Signed)
From last note continue with zembrace.  Renewed prescription.

## 2019-12-18 ENCOUNTER — Encounter: Payer: Self-pay | Admitting: Family Medicine

## 2019-12-18 MED ORDER — ZOLMITRIPTAN 5 MG PO TBDP: ORAL_TABLET | ORAL | 4 refills | Status: DC

## 2020-03-05 ENCOUNTER — Other Ambulatory Visit: Payer: Self-pay | Admitting: Obstetrics & Gynecology

## 2020-03-05 DIAGNOSIS — Z1231 Encounter for screening mammogram for malignant neoplasm of breast: Secondary | ICD-10-CM

## 2020-03-20 ENCOUNTER — Ambulatory Visit: Payer: Medicare HMO

## 2020-04-03 ENCOUNTER — Ambulatory Visit
Admission: RE | Admit: 2020-04-03 | Discharge: 2020-04-03 | Disposition: A | Discharge status: Home / Self Care | Payer: PPO | Source: Ambulatory Visit | Attending: Obstetrics & Gynecology | Admitting: Obstetrics & Gynecology

## 2020-04-03 ENCOUNTER — Other Ambulatory Visit: Payer: Self-pay

## 2020-04-03 DIAGNOSIS — Z1231 Encounter for screening mammogram for malignant neoplasm of breast: Secondary | ICD-10-CM | POA: Diagnosis not present

## 2020-05-11 ENCOUNTER — Other Ambulatory Visit: Payer: Self-pay | Admitting: *Deleted

## 2020-05-11 MED ORDER — ZOLMITRIPTAN 5 MG PO TBDP: ORAL_TABLET | ORAL | 4 refills | Status: DC

## 2020-05-27 ENCOUNTER — Encounter: Payer: Self-pay | Admitting: Family Medicine

## 2020-06-03 DIAGNOSIS — Z124 Encounter for screening for malignant neoplasm of cervix: Secondary | ICD-10-CM | POA: Diagnosis not present

## 2020-06-03 DIAGNOSIS — Z681 Body mass index (BMI) 19 or less, adult: Secondary | ICD-10-CM | POA: Diagnosis not present

## 2020-07-16 DIAGNOSIS — D2272 Melanocytic nevi of left lower limb, including hip: Secondary | ICD-10-CM | POA: Diagnosis not present

## 2020-07-16 DIAGNOSIS — L814 Other melanin hyperpigmentation: Secondary | ICD-10-CM | POA: Diagnosis not present

## 2020-07-16 DIAGNOSIS — D1801 Hemangioma of skin and subcutaneous tissue: Secondary | ICD-10-CM | POA: Diagnosis not present

## 2020-07-16 DIAGNOSIS — Z86018 Personal history of other benign neoplasm: Secondary | ICD-10-CM | POA: Diagnosis not present

## 2020-07-16 DIAGNOSIS — D2271 Melanocytic nevi of right lower limb, including hip: Secondary | ICD-10-CM | POA: Diagnosis not present

## 2020-07-16 DIAGNOSIS — L570 Actinic keratosis: Secondary | ICD-10-CM | POA: Diagnosis not present

## 2020-07-16 DIAGNOSIS — D225 Melanocytic nevi of trunk: Secondary | ICD-10-CM | POA: Diagnosis not present

## 2020-07-16 DIAGNOSIS — L578 Other skin changes due to chronic exposure to nonionizing radiation: Secondary | ICD-10-CM | POA: Diagnosis not present

## 2020-07-16 DIAGNOSIS — L821 Other seborrheic keratosis: Secondary | ICD-10-CM | POA: Diagnosis not present

## 2020-07-23 ENCOUNTER — Telehealth: Payer: Self-pay | Admitting: Family Medicine

## 2020-07-23 MED ORDER — DIVALPROEX SODIUM ER 250 MG PO TB24: 750.0000 mg | ORAL_TABLET | Freq: Every day | ORAL | 0 refills | Status: DC

## 2020-07-23 NOTE — Telephone Encounter (Signed)
Pt request refill divalproex (DEPAKOTE ER) 250 MG 24 hr tablet at [facility] at [facility]

## 2020-07-24 DIAGNOSIS — E7800 Pure hypercholesterolemia, unspecified: Secondary | ICD-10-CM | POA: Diagnosis not present

## 2020-07-24 DIAGNOSIS — D7589 Other specified diseases of blood and blood-forming organs: Secondary | ICD-10-CM | POA: Diagnosis not present

## 2020-07-24 DIAGNOSIS — Z1159 Encounter for screening for other viral diseases: Secondary | ICD-10-CM | POA: Diagnosis not present

## 2020-07-24 DIAGNOSIS — N289 Disorder of kidney and ureter, unspecified: Secondary | ICD-10-CM | POA: Diagnosis not present

## 2020-08-05 ENCOUNTER — Encounter: Payer: Self-pay | Admitting: Family Medicine

## 2020-08-05 ENCOUNTER — Ambulatory Visit: Payer: PPO | Admitting: Family Medicine

## 2020-08-05 VITALS — BP 103/63 | HR 79 | Ht 63.0 in | Wt 98.8 lb

## 2020-08-05 DIAGNOSIS — G43709 Chronic migraine without aura, not intractable, without status migrainosus: Secondary | ICD-10-CM | POA: Diagnosis not present

## 2020-08-05 MED ORDER — UBRELVY 100 MG PO TABS: 100.0000 mg | ORAL_TABLET | Freq: Every day | ORAL | 11 refills | Status: DC | PRN

## 2020-08-05 MED ORDER — DIVALPROEX SODIUM ER 250 MG PO TB24: 750.0000 mg | ORAL_TABLET | Freq: Every day | ORAL | 3 refills | Status: DC

## 2020-08-05 MED ORDER — EMGALITY 120 MG/ML ~~LOC~~ SOAJ
1.0000 mL | SUBCUTANEOUS | 3 refills | Status: DC
Start: 2020-08-05 — End: 2021-08-03

## 2020-08-05 MED ORDER — ZOLMITRIPTAN 5 MG PO TBDP: ORAL_TABLET | ORAL | 11 refills | Status: DC

## 2020-08-05 NOTE — Progress Notes (Signed)
PATIENT: Patricia Chapman DOB: [birth year]  REASON FOR VISIT: follow up HISTORY FROM: patient  Chief Complaint  Patient presents with  . Follow-up    RM 3 alone  Pt states she takes the meds about 6 times a month, she feels her migraines have increased slightly in the last yr     HISTORY OF PRESENT ILLNESS: Today 08/05/20   Presents today for follow-up on migraines.  She continues Emgality, divalproex ER 750 mg daily and Zomig for abortive therapy.  She reports that migraines are fairly stable, overall.  She may have 5-7 migrainous days each month.  She does feel that there may have been just a slight increase in the number of headaches she has since last being seen.  She does not feel that Zomig works as fast as she would like for it to.  Eventually it does seem to help somewhat.  She has tried sumatriptan injections in the past that were fairly effective but too expensive.  She feels that memory is fairly stable.  She is able to maintain her home and finances.  She continues to work.  She is driving without difficulty.  History (copied from previous note)   Patricia Chapman is a 69 y.o. female here today for follow up for migraines. She continues Emgality monthly, divalproex ER 750mg  daily and Zembrace or Zomig for abortive therapy. She has tried to wean divalproex but reports a significant worsening of migraines with lower doses. She denies obvious adverse effects with medications. On average she may have 3-4 migraines one month and none the next. Overall she feels that she is doing very well. She has CPE scheduled with PCP this month. She does continue to note short term memory loss. She describes concerns of forgetting if she has cleaned something, hard time recalling names at times, forgetting why she walked into a room. She is able to perform all ADL's. She is driving without difficulty. She is able to dose her medications appropriately and is very clear of what she is taking. No family  history of neurodegenerative disease.    HISTORY: (copied from Dr note on 07/19/2018)  Interval history 07/19/2018: doing incredibly well. Only 2 migraines in 4 weeks. She does great. The migraines are shorter only last 1-2 days (used to last up to 5 days). She tried to go off of depakote and worsened now back on and better. She takes zomig and naproxen.  Will try Zembrace. She doesn't sleep well. She gets up to urinate. She can;t go back to sleep. She has trouble initiating sleep. She denies racing thoughts. She goes to be 11-12 and sleeps until 9-10. No symptoms Discussed good sleep habits, discussed sleep counseling. Suggested going to be at midnight and waking up at 8, she sleeps until 10.   HPI:  Patricia Chapman is a 70 y.o. female here as a referral from Dr. 73 for migraines. She was a patient of dr. Joellyn Quails and then Dr. Meryl Crutch.  She has had headaches since her early 16s, typical migraines are lateralized severe pounding headaches with associated light noise sensitivity, no nausea, lasting 4 to 72 hours.  Previous triggers were wind perfume bright light stress not sleeping well and weather changes.  She has had 3 shots of Emgality. She has only had 4 headache days last month. In January had 15.  We discussed migraines and migraine management, at this point she appears to be doing well on and reality.  Her migraine frequency  has been reduced.  The for headache days last month were migrainous however they were less severe no known inciting events or triggers, they resolve with acute management.  Reviewed notes, labs and imaging from outside physicians, which showed:  Reviewed prior notes.  Patient had headaches for almost 50 years.  She had seen multiple neurologist in the [place].  Typical migraines are unilateral severe pulsating with associated photophobia and phonophobia but without nausea or vomiting.  Over the year she suffered with severe frequent migraine headaches.  Currently on  Depakote.  Last visit with Dr. Krista Blue prior to starting a modality she reported 8 migraine days in a month minimum taking Zomig as needed with breakthrough headaches.  Dr. Krista Blue reviewed notes from Dr. Bobby Rumpf.  She is tried and failed multiple preventative medications in the past including:  Gabapentin, Inderal, imipramine, Topamax (developed rash), amitriptyline, nortriptyline, Effexor, verapamil, protriptyline, Zonegran, Keppra, memantine which caused mood difficulty, could not tolerate magnesium.  Also had no significant benefit with Botox injections x2.  She also had neurostimulator unit, acupuncture, riboflavin, Q enzyme every 10 which have not been helpful.  For abortive treatment she is tried Imitrex, Maxalt, Zomig.  TSH nml in 2016   REVIEW OF SYSTEMS: Out of a complete 14 system review of symptoms, the patient complains only of the following symptoms, headaches, memory loss and all other reviewed systems are negative.   ALLERGIES: Allergies  Allergen Reactions  . Penicillins     [place] as a young adult    HOME MEDICATIONS: Outpatient Medications Prior to Visit  Medication Sig Dispense Refill  . CALCIUM PO Take 600 mg by mouth daily.    . Cholecalciferol (VITAMIN D3 PO) Take 5,000 Int'l Units by mouth daily.    . mirabegron ER (MYRBETRIQ) 25 MG TB24 tablet Take 25 mg by mouth daily.    . divalproex (DEPAKOTE ER) 250 MG 24 hr tablet Take 3 tablets (750 mg total) by mouth daily. 270 tablet 0  . EMGALITY 120 MG/ML SOAJ INJECT 120 MG INTO THE SKIN EVERY 30 DAYS 1 pen 8  . zolmitriptan (ZOMIG-ZMT) 5 MG disintegrating tablet DISSOLVE ONE TABLET BY MOUTH  AS NEEDED 6 tablet 4  . vitamin C (ASCORBIC ACID) 250 MG tablet Take 250 mg by mouth daily. (Patient not taking: Reported on 08/05/2020)    . SUMAtriptan Succinate (ZEMBRACE SYMTOUCH) 3 MG/0.5ML SOAJ Inject 3 mg into the skin every 15 (fifteen) minutes for 1 dose. May repeat in 15 minutes.  Max 4 injections daily. 8 pen 5   No  facility-administered medications prior to visit.    PAST MEDICAL HISTORY: Past Medical History:  Diagnosis Date  . Chicken pox   . Migraines     PAST SURGICAL HISTORY: Past Surgical History:  Procedure Laterality Date  . BREAST BIOPSY    . BREAST EXCISIONAL BIOPSY Right 1991   benign  . FINGER SURGERY Left     FAMILY HISTORY: Family History  Problem Relation Age of Onset  . Hyperlipidemia Mother   . Stroke Mother   . Hypertension Mother   . Congestive Heart Failure Mother   . Heart disease Maternal Grandmother   . Kidney disease Maternal Grandfather   . Stroke Father     SOCIAL HISTORY: Social History   Socioeconomic History  . Marital status: Married    Spouse name: Not on file  . Number of children: 2  . Years of education: 16  . Highest education level: Bachelor's degree (e.g., BA, AB, BS)  Occupational History  . Occupation: Part-time for her self-owned print shop  Tobacco Use  . Smoking status: Never Smoker  . Smokeless tobacco: Never Used  Vaping Use  . Vaping Use: Never used  Substance and Sexual Activity  . Alcohol use: No  . Drug use: No  . Sexual activity: Never  Other Topics Concern  . Not on file  Social History Narrative   Lives at home with her husband.   Right-handed.   No caffeine use.   She is going to be a grandmother December 2019    Social Determinants of Corporate investment banker Strain: Not on file  Food Insecurity: Not on file  Transportation Needs: Not on file  Physical Activity: Not on file  Stress: Not on file  Social Connections: Not on file  Intimate Partner Violence: Not on file      PHYSICAL EXAM  Vitals:   08/05/20 1529  BP: 103/63  Pulse: 79  Weight: 98 lb 12.8 oz (44.8 kg)  Height: 5\' 3"  (1.6 m)   Body mass index is 17.5 kg/m.  Generalized: Well developed, in no acute distress  Cardiology: normal rate and rhythm, no murmur noted Neurological examination  Mentation: Alert oriented to time, place,  history taking. Follows all commands speech and language fluent Cranial nerve II-XII: Pupils were equal round reactive to light. Extraocular movements were full, visual field were full on confrontational test. Facial sensation and strength were normal. Uvula tongue midline. Head turning and shoulder shrug  were normal and symmetric. Motor: The motor testing reveals 5 over 5 strength of all 4 extremities. Good symmetric motor tone is noted throughout.   Gait and station: Gait is normal.     DIAGNOSTIC DATA (LABS, IMAGING, TESTING) - I reviewed patient records, labs, notes, testing and imaging myself where available.  No flowsheet data found.   Lab Results  Component Value Date   WBC 6.4 08/15/2014   HGB 13.8 08/15/2014   HCT 41.9 08/15/2014   MCV 93.3 08/15/2014   PLT 293 08/15/2014      Component Value Date/Time   NA 143 07/10/2019 1534   K 4.5 07/10/2019 1534   CL 102 07/10/2019 1534   CO2 30 (H) 07/10/2019 1534   GLUCOSE 89 07/10/2019 1534   GLUCOSE 85 08/15/2014 1634   BUN 26 07/10/2019 1534   CREATININE 0.95 07/10/2019 1534   CREATININE 0.88 08/15/2014 1634   CALCIUM 9.4 07/10/2019 1534   PROT 6.5 07/10/2019 1534   ALBUMIN 3.8 07/10/2019 1534   AST 16 07/10/2019 1534   ALT 10 07/10/2019 1534   ALKPHOS 62 07/10/2019 1534   BILITOT <0.2 07/10/2019 1534   GFRNONAA 61 07/10/2019 1534   GFRAA 71 07/10/2019 1534   Lab Results  Component Value Date   CHOL 258 (H) 11/18/2014   HDL 64.10 11/18/2014   LDLCALC 169 (H) 11/18/2014   TRIG 125.0 11/18/2014   CHOLHDL 4 11/18/2014   No results found for: HGBA1C Lab Results  Component Value Date   VITAMINB12 719 10/20/2011   Lab Results  Component Value Date   TSH 0.452 08/15/2014       ASSESSMENT AND PLAN 69 y.o. year old female  has a past medical history of Chicken pox and Migraines. here with     ICD-10-CM   1. Chronic migraine without aura without status migrainosus, not intractable  G43.709      Analiya is  doing fairly well from a migraine perspective.  She is tolerating Windell Moulding  and divalproex as prescribed without obvious adverse effects.  She has tried weaning divalproex in the past but reports migraines worsen.  She will continue current therapy.  She may continue Zomig as needed for abortive therapy.  I will also try to get [name] covered as this may work a little quicker for her.  She is aware that she may take 1 tablet at the onset of a headache and repeat 1 tablet in 2 hours if needed.  No more than 2 tablets in 24 hours or 10 tablets/month of the either medication combined.  Healthy lifestyle habits encouraged.  She will follow-up with me in 1 year, sooner if needed.  She verbalizes understanding and agreement with this plan.   No orders of the defined types were placed in this encounter.    Meds ordered this encounter  Medications  . Ubrogepant (UBRELVY) 100 MG TABS    Sig: Take 100 mg by mouth daily as needed.    Dispense:  10 tablet    Refill:  11    Order Specific Question:   Supervising Provider    Answer:   Anson Fret [id number]  . divalproex (DEPAKOTE ER) 250 MG 24 hr tablet    Sig: Take 3 tablets (750 mg total) by mouth daily.    Dispense:  270 tablet    Refill:  3    Please let her know to call and make appointment [phone]    Order Specific Question:   Supervising Provider    Answer:   Anson Fret [[id number]]  . Galcanezumab-gnlm (EMGALITY) 120 MG/ML SOAJ    Sig: Inject 1 mL into the skin every 30 (thirty) days.    Dispense:  3 mL    Refill:  3    Order Specific Question:   Supervising Provider    Answer:   Anson Fret [id number]  . zolmitriptan (ZOMIG-ZMT) 5 MG disintegrating tablet    Sig: DISSOLVE ONE TABLET BY MOUTH  AS NEEDED    Dispense:  6 tablet    Refill:  11    Order Specific Question:   Supervising Provider    Answer:   Anson Fret [[id number]]      I spent 15 minutes with the patient. 50% of this time was spent counseling and  educating patient on plan of care and medications.    Shawnie Dapper, FNP-C 08/05/2020, 4:09 PM [facility] [address] [place], [place] [address] [phone]  Made any corrections needed, and agree with history, physical, neuro exam,assessment and plan as stated.     Naomie Dean, MD [facility]

## 2020-08-05 NOTE — Patient Instructions (Signed)
Below is our plan:  We will continue Emgality and divalproex as prescribed. Continue Zomig as needed for abortive therapy. I will call in Ubrelvy as well to see if it may work a little quicker. You may take one at onset of migraine. May repeat 1 tablet in 2 hours, if needed. No more than 2 tablet in 24 hours or 10 per month.  Remember not to use either on a consistent basis.   Please make sure you are staying well hydrated. I recommend 50-60 ounces daily. Well balanced diet and regular exercise encouraged.    Please continue follow up with care team as directed.   Follow up in 1 year   You may receive a survey regarding today's visit. I encourage you to leave honest feed back as I do use this information to improve patient care. Thank you for seeing me today!    Ubrogepant tablets What is this medicine? UBROGEPANT (ue BROE je pant) is used to treat migraine headaches with or without aura. An aura is a strange feeling or visual disturbance that warns you of an attack. It is not used to prevent migraines. This medicine may be used for other purposes; ask your health care provider or pharmacist if you have questions. COMMON BRAND NAME(S): [name] What should I tell my health care provider before I take this medicine? They need to know if you have any of these conditions:  kidney disease  liver disease  an unusual or allergic reaction to ubrogepant, other medicines, foods, dyes, or preservatives  pregnant or trying to get pregnant  breast-feeding How should I use this medicine? Take this medicine by mouth with a glass of water. Follow the directions on the prescription label. You can take it with or without food. If it upsets your stomach, take it with food. Take your medicine at regular intervals. Do not take it more often than directed. Do not stop taking except on your doctor's advice. Talk to your pediatrician about the use of this medicine in children. Special care may be  needed. Overdosage: If you think you have taken too much of this medicine contact a poison control center or emergency room at once. NOTE: This medicine is only for you. Do not share this medicine with others. What if I miss a dose? This does not apply. This medicine is not for regular use. What may interact with this medicine? Do not take this medicine with any of the following medicines:  ceritinib  certain antibiotics like chloramphenicol, clarithromycin, telithromycin  certain antivirals for HIV like atazanavir, cobicistat, darunavir, delavirdine, fosamprenavir, indinavir, ritonavir  certain medicines for fungal infections like itraconazole, ketoconazole, posaconazole, voriconazole  conivaptan  grapefruit  idelalisib  mifepristone  nefazodone  ribociclib This medicine may also interact with the following medications:  carvedilol  certain medicines for seizures like phenobarbital, phenytoin  ciprofloxacin  cyclosporine  eltrombopag  fluconazole  fluvoxamine  quinidine  rifampin  St. John's wort  verapamil This list may not describe all possible interactions. Give your health care provider a list of all the medicines, herbs, non-prescription drugs, or dietary supplements you use. Also tell them if you smoke, drink alcohol, or use illegal drugs. Some items may interact with your medicine. What should I watch for while using this medicine? Visit your health care professional for regular checks on your progress. Tell your health care professional if your symptoms do not start to get better or if they get worse. Your mouth may get dry. Chewing sugarless gum  or sucking hard candy and drinking plenty of water may help. Contact your health care professional if the problem does not go away or is severe. What side effects may I notice from receiving this medicine? Side effects that you should report to your doctor or health care professional as soon as  possible:  allergic reactions like skin rash, itching or hives; swelling of the face, lips, or tongue Side effects that usually do not require medical attention (report these to your doctor or health care professional if they continue or are bothersome):  drowsiness  dry mouth  nausea  tiredness This list may not describe all possible side effects. Call your doctor for medical advice about side effects. You may report side effects to FDA at 1-800-FDA-1088. Where should I keep my medicine? Keep out of the reach of children. Store at room temperature between 15 and 30 degrees C (59 and 86 degrees F). Throw away any unused medicine after the expiration date. NOTE: This sheet is a summary. It may not cover all possible information. If you have questions about this medicine, talk to your doctor, pharmacist, or health care provider.  2020 Elsevier/Gold Standard (2018-10-11 08:50:55)   Migraine Headache A migraine headache is a very strong throbbing pain on one side or both sides of your head. This type of headache can also cause other symptoms. It can last from 4 hours to 3 days. Talk with your doctor about what things may bring on (trigger) this condition. What are the causes? The exact cause of this condition is not known. This condition may be triggered or caused by:  Drinking alcohol.  Smoking.  Taking medicines, such as: ? Medicine used to treat chest pain (nitroglycerin). ? Birth control pills. ? Estrogen. ? Some blood pressure medicines.  Eating or drinking certain products.  Doing physical activity. Other things that may trigger a migraine headache include:  Having a menstrual period.  Pregnancy.  Hunger.  Stress.  Not getting enough sleep or getting too much sleep.  Weather changes.  Tiredness (fatigue). What increases the risk?  Being 54-37 years old.  Being female.  Having a family history of migraine headaches.  Being Caucasian.  Having depression  or anxiety.  Being very overweight. What are the signs or symptoms?  A throbbing pain. This pain may: ? Happen in any area of the head, such as on one side or both sides. ? Make it hard to do daily activities. ? Get worse with physical activity. ? Get worse around bright lights or loud noises.  Other symptoms may include: ? Feeling sick to your stomach (nauseous). ? Vomiting. ? Dizziness. ? Being sensitive to bright lights, loud noises, or smells.  Before you get a migraine headache, you may get warning signs (an aura). An aura may include: ? Seeing flashing lights or having blind spots. ? Seeing bright spots, halos, or zigzag lines. ? Having tunnel vision or blurred vision. ? Having numbness or a tingling feeling. ? Having trouble talking. ? Having weak muscles.  Some people have symptoms after a migraine headache (postdromal phase), such as: ? Tiredness. ? Trouble thinking (concentrating). How is this treated?  Taking medicines that: ? Relieve pain. ? Relieve the feeling of being sick to your stomach. ? Prevent migraine headaches.  Treatment may also include: ? Having acupuncture. ? Avoiding foods that bring on migraine headaches. ? Learning ways to control your body functions (biofeedback). ? Therapy to help you know and deal with negative thoughts (cognitive behavioral  therapy). Follow these instructions at home: Medicines  Take over-the-counter and prescription medicines only as told by your doctor.  Ask your doctor if the medicine prescribed to you: ? Requires you to avoid driving or using heavy machinery. ? Can cause trouble pooping (constipation). You may need to take these steps to prevent or treat trouble pooping:  Drink enough fluid to keep your pee (urine) pale yellow.  Take over-the-counter or prescription medicines.  Eat foods that are high in fiber. These include beans, whole grains, and fresh fruits and vegetables.  Limit foods that are high in  fat and sugar. These include fried or sweet foods. Lifestyle  Do not drink alcohol.  Do not use any products that contain nicotine or tobacco, such as cigarettes, e-cigarettes, and chewing tobacco. If you need help quitting, ask your doctor.  Get at least 8 hours of sleep every night.  Limit and deal with stress. General instructions      Keep a journal to find out what may bring on your migraine headaches. For example, write down: ? What you eat and drink. ? How much sleep you get. ? Any change in what you eat or drink. ? Any change in your medicines.  If you have a migraine headache: ? Avoid things that make your symptoms worse, such as bright lights. ? It may help to lie down in a dark, quiet room. ? Do not drive or use heavy machinery. ? Ask your doctor what activities are safe for you.  Keep all follow-up visits as told by your doctor. This is important. Contact a doctor if:  You get a migraine headache that is different or worse than others you have had.  You have more than 15 headache days in one month. Get help right away if:  Your migraine headache gets very bad.  Your migraine headache lasts longer than 72 hours.  You have a fever.  You have a stiff neck.  You have trouble seeing.  Your muscles feel weak or like you cannot control them.  You start to lose your balance a lot.  You start to have trouble walking.  You pass out (faint).  You have a seizure. Summary  A migraine headache is a very strong throbbing pain on one side or both sides of your head. These headaches can also cause other symptoms.  This condition may be treated with medicines and changes to your lifestyle.  Keep a journal to find out what may bring on your migraine headaches.  Contact a doctor if you get a migraine headache that is different or worse than others you have had.  Contact your doctor if you have more than 15 headache days in a month. This information is not  intended to replace advice given to you by your health care provider. Make sure you discuss any questions you have with your health care provider. Document Revised: 11/16/2018 Document Reviewed: 09/06/2018 Elsevier Patient Education  2020 [employer].

## 2020-08-25 DIAGNOSIS — H2513 Age-related nuclear cataract, bilateral: Secondary | ICD-10-CM | POA: Diagnosis not present

## 2020-08-25 DIAGNOSIS — H524 Presbyopia: Secondary | ICD-10-CM | POA: Diagnosis not present

## 2020-08-25 DIAGNOSIS — H04123 Dry eye syndrome of bilateral lacrimal glands: Secondary | ICD-10-CM | POA: Diagnosis not present

## 2020-08-25 DIAGNOSIS — H43813 Vitreous degeneration, bilateral: Secondary | ICD-10-CM | POA: Diagnosis not present

## 2020-09-08 ENCOUNTER — Telehealth: Payer: Self-pay

## 2020-09-08 NOTE — Telephone Encounter (Signed)
I have submitted a PA request for Ubrelvy 100mg  on [facility], Key: [id number].   Awaiting determination from [facility].

## 2020-09-09 NOTE — Telephone Encounter (Signed)
PA Case: [id number], Status: Approved, Coverage Starts on: 09/08/2020 12:00:00 AM, Coverage Ends on: 09/08/2021 12:00:00 AM.

## 2021-03-08 ENCOUNTER — Other Ambulatory Visit: Payer: Self-pay | Admitting: Obstetrics & Gynecology

## 2021-03-08 DIAGNOSIS — Z1231 Encounter for screening mammogram for malignant neoplasm of breast: Secondary | ICD-10-CM

## 2021-04-27 ENCOUNTER — Ambulatory Visit
Admission: RE | Admit: 2021-04-27 | Discharge: 2021-04-27 | Disposition: A | Discharge status: Home / Self Care | Payer: PPO | Source: Ambulatory Visit | Attending: Obstetrics & Gynecology | Admitting: Obstetrics & Gynecology

## 2021-04-27 ENCOUNTER — Other Ambulatory Visit: Payer: Self-pay

## 2021-04-27 DIAGNOSIS — Z1231 Encounter for screening mammogram for malignant neoplasm of breast: Secondary | ICD-10-CM

## 2021-06-07 DIAGNOSIS — M816 Localized osteoporosis [Lequesne]: Secondary | ICD-10-CM | POA: Diagnosis not present

## 2021-06-07 DIAGNOSIS — Z681 Body mass index (BMI) 19 or less, adult: Secondary | ICD-10-CM | POA: Diagnosis not present

## 2021-06-07 DIAGNOSIS — N958 Other specified menopausal and perimenopausal disorders: Secondary | ICD-10-CM | POA: Diagnosis not present

## 2021-06-07 DIAGNOSIS — Z01419 Encounter for gynecological examination (general) (routine) without abnormal findings: Secondary | ICD-10-CM | POA: Diagnosis not present

## 2021-07-09 DIAGNOSIS — L814 Other melanin hyperpigmentation: Secondary | ICD-10-CM | POA: Diagnosis not present

## 2021-07-09 DIAGNOSIS — L299 Pruritus, unspecified: Secondary | ICD-10-CM | POA: Diagnosis not present

## 2021-07-09 DIAGNOSIS — Z23 Encounter for immunization: Secondary | ICD-10-CM | POA: Diagnosis not present

## 2021-07-20 ENCOUNTER — Telehealth: Payer: Self-pay | Admitting: Family Medicine

## 2021-07-20 NOTE — Telephone Encounter (Signed)
Amy will be out on this day so I rescheduled the appointment and sent the patient a mychart message.

## 2021-08-03 ENCOUNTER — Other Ambulatory Visit: Payer: Self-pay

## 2021-08-03 MED ORDER — EMGALITY 120 MG/ML ~~LOC~~ SOAJ
1.0000 mL | SUBCUTANEOUS | 0 refills | Status: DC
Start: 2021-08-03 — End: 2021-11-09

## 2021-08-05 ENCOUNTER — Ambulatory Visit: Payer: PPO | Admitting: Family Medicine

## 2021-08-12 ENCOUNTER — Ambulatory Visit: Payer: PPO | Admitting: Family Medicine

## 2021-09-08 ENCOUNTER — Telehealth: Payer: Self-pay | Admitting: Family Medicine

## 2021-09-08 MED ORDER — ZOLMITRIPTAN 5 MG PO TBDP: ORAL_TABLET | ORAL | 0 refills | Status: DC

## 2021-09-08 NOTE — Telephone Encounter (Signed)
Refill sent to the pharmacy for the pt as requested. Has upcoming apt scheduled for next week and must keep the upcoming apt

## 2021-09-08 NOTE — Telephone Encounter (Signed)
Pt is requesting a refill for zolmitriptan (ZOMIG-ZMT) 5 MG disintegrating tablet.  Pharmacy:  [facility] [phone]

## 2021-09-13 NOTE — Progress Notes (Signed)
PATIENT: Patricia Chapman DOB: 1951/06/14  REASON FOR VISIT: follow Chapman HISTORY FROM: patient  Chief Complaint  Patient presents with   Follow-Chapman    Pt alone, rm 11. Here today following Chapman for yearly medication management. Over the last yr she avg taking the zolmitritan 6 days a month. She tried the [place] and it didn't help at all. Would like to discuss something to help make this better     HISTORY OF PRESENT ILLNESS: 09/14/21 Patricia Chapman. She continues Emgality, divalproex ER 750mg  daily and zolmitriptan as needed. [name] was not effective. She reports she is having 8-10 headache days a month with about 6 migrainous days. Zolmitriptan helps. She reports that it usually a couple of hours to work. She does not usually have to take another dose. She wishes zolmitriptan would work quick. She is tried Imitrex oral and injections, Maxalt, Zomig and Ubrelvy.   08/05/2020 ALL:  Presents today for follow-Chapman on migraines.  She continues Emgality, divalproex ER 750 mg daily and Zomig for abortive therapy.  She reports that migraines are fairly stable, overall.  She may have 5-7 migrainous days each month.  She does feel that there may have been just a slight increase in the number of headaches she has since last being seen.  She does not feel that Zomig works as fast as she would like for it to.  Eventually it does seem to help somewhat.  She has tried sumatriptan injections in the past that were fairly effective but too expensive.  She feels that memory is fairly stable.  She is able to maintain her home and finances.  She continues to work.  She is driving without difficulty.  History (copied from previous note)   Patricia Chapman is a 71 y.o. female here today for follow Chapman for migraines. She continues Emgality monthly, divalproex ER 750mg  daily and Zembrace or Zomig for abortive therapy. She has tried to wean divalproex but reports a significant worsening of  migraines with lower doses. She denies obvious adverse effects with medications. On average she may have 3-4 migraines one month and none the next. Overall she feels that she is doing very well. She has CPE scheduled with PCP this month. She does continue to note short term memory loss. She describes concerns of forgetting if she has cleaned something, hard time recalling names at times, forgetting why she walked into a room. She is able to perform all ADL's. She is driving without difficulty. She is able to dose her medications appropriately and is very clear of what she is taking. No family history of neurodegenerative disease.   HISTORY: (copied from Dr Cathren Laine note on 07/19/2018)  Interval history 07/19/2018: doing incredibly well. Only 2 migraines in 4 weeks. She does great. The migraines are shorter only last 1-2 days (used to last Chapman to 5 days). She tried to go off of depakote and worsened now back on and better. She takes zomig and naproxen.  Will try Zembrace. She doesn't sleep well. She gets Chapman to urinate. She can;t go back to sleep. She has trouble initiating sleep. She denies racing thoughts. She goes to be 11-12 and sleeps until 9-10. No symptoms Discussed good sleep habits, discussed sleep counseling. Suggested going to be at midnight and waking Chapman at 8, she sleeps until 10.    HPI:  Patricia Chapman is a 71 y.o. female here as a referral from Dr. Hoy Morn for migraines. She  was a patient of dr. Earley Favor and then Dr. Melton Alar.  She has had headaches since her early 62s, typical migraines are lateralized severe pounding headaches with associated light noise sensitivity, no nausea, lasting 4 to 72 hours.  Previous triggers were wind perfume bright light stress not sleeping well and weather changes.  She has had 3 shots of Emgality. She has only had 4 headache days last month. In January had 15.  We discussed migraines and migraine management, at this point she appears to be doing well on and reality.  Her  migraine frequency has been reduced.  The for headache days last month were migrainous however they were less severe no known inciting events or triggers, they resolve with acute management.   Reviewed notes, labs and imaging from outside physicians, which showed:   Reviewed prior notes.  Patient had headaches for almost 50 years.  She had seen multiple neurologist in the pack.  Typical migraines are unilateral severe pulsating with associated photophobia and phonophobia but without nausea or vomiting.  Over the year she suffered with severe frequent migraine headaches.  Currently on Depakote.  Last visit with Dr. Krista Blue prior to starting a modality she reported 8 migraine days in a month minimum taking Zomig as needed with breakthrough headaches.  Dr. Krista Blue reviewed notes from Dr. Bobby Rumpf.  She is tried and failed multiple preventative medications in the past including:   Gabapentin, Inderal, imipramine, Topamax (developed rash), amitriptyline, nortriptyline, Effexor, verapamil, protriptyline, Zonegran, Keppra, memantine which caused mood difficulty, could not tolerate magnesium.  Also had no significant benefit with Botox injections x2.  She also had neurostimulator unit, acupuncture, riboflavin, Q enzyme every 10 which have not been helpful.  For abortive treatment she is tried Imitrex, Maxalt, Zomig.   TSH nml in 2016   REVIEW OF SYSTEMS: Out of a complete 14 system review of symptoms, the patient complains only of the following symptoms, headaches, memory loss and all other reviewed systems are negative.   ALLERGIES: Allergies  Allergen Reactions   Penicillins     Whelps as a young adult    HOME MEDICATIONS: Outpatient Medications Prior to Visit  Medication Sig Dispense Refill   CALCIUM PO Take 600 mg by mouth daily.     divalproex (DEPAKOTE ER) 250 MG 24 hr tablet Take 3 tablets (750 mg total) by mouth daily. 270 tablet 3   Galcanezumab-gnlm (EMGALITY) 120 MG/ML SOAJ Inject 1 mL into the  skin every 30 (thirty) days. 3 mL 0   Vibegron (GEMTESA) 75 MG TABS      zolmitriptan (ZOMIG-ZMT) 5 MG disintegrating tablet DISSOLVE ONE TABLET BY MOUTH  AS NEEDED 6 tablet 0   Cholecalciferol (VITAMIN D3 PO) Take 5,000 Int'l Units by mouth daily.     mirabegron ER (MYRBETRIQ) 25 MG TB24 tablet Take 25 mg by mouth daily.     Ubrogepant (UBRELVY) 100 MG TABS Take 100 mg by mouth daily as needed. 10 tablet 11   vitamin C (ASCORBIC ACID) 250 MG tablet Take 250 mg by mouth daily. (Patient not taking: Reported on 08/05/2020)     No facility-administered medications prior to visit.    PAST MEDICAL HISTORY: Past Medical History:  Diagnosis Date   Chicken pox    Migraines     PAST SURGICAL HISTORY: Past Surgical History:  Procedure Laterality Date   BREAST BIOPSY     BREAST EXCISIONAL BIOPSY Right 1991   benign   FINGER SURGERY Left     FAMILY HISTORY: Family  History  Problem Relation Age of Onset   Hyperlipidemia Mother    Stroke Mother    Hypertension Mother    Congestive Heart Failure Mother    Heart disease Maternal Grandmother    Kidney disease Maternal Grandfather    Stroke Father     SOCIAL HISTORY: Social History   Socioeconomic History   Marital status: Married    Spouse name: Not on file   Number of children: 2   Years of education: 16   Highest education level: Bachelor's degree (e.g., BA, AB, BS)  Occupational History   Occupation: Part-time for her self-owned print shop  Tobacco Use   Smoking status: Never   Smokeless tobacco: Never  Vaping Use   Vaping Use: Never used  Substance and Sexual Activity   Alcohol use: No   Drug use: No   Sexual activity: Never  Other Topics Concern   Not on file  Social History Narrative   Lives at home with her husband.   Right-handed.   No caffeine use.   She is going to be a grandmother December 2019    Social Determinants of Radio broadcast assistant Strain: Not on file  Food Insecurity: Not on file   Transportation Needs: Not on file  Physical Activity: Not on file  Stress: Not on file  Social Connections: Not on file  Intimate Partner Violence: Not on file      PHYSICAL EXAM  Vitals:   09/14/21 1333  BP: 101/69  Pulse: (!) 101  Weight: 99 lb (44.9 kg)  Height: 5\' 3"  (1.6 m)    Body mass index is 17.54 kg/m.  Generalized: Well developed, in no acute distress  Cardiology: normal rate and rhythm, no murmur noted Neurological examination  Mentation: Alert oriented to time, place, history taking. Follows all commands speech and language fluent Cranial nerve II-XII: Pupils were equal round reactive to light. Extraocular movements were full, visual field were full on confrontational test. Facial sensation and strength were normal. Uvula tongue midline. Head turning and shoulder shrug  were normal and symmetric. Motor: The motor testing reveals 5 over 5 strength of all 4 extremities. Good symmetric motor tone is noted throughout.   Gait and station: Gait is normal.     DIAGNOSTIC DATA (LABS, IMAGING, TESTING) - I reviewed patient records, labs, notes, testing and imaging myself where available.  No flowsheet data found.   Lab Results  Component Value Date   WBC 6.4 08/15/2014   HGB 13.8 08/15/2014   HCT 41.9 08/15/2014   MCV 93.3 08/15/2014   PLT 293 08/15/2014      Component Value Date/Time   NA 143 07/10/2019 1534   K 4.5 07/10/2019 1534   CL 102 07/10/2019 1534   CO2 30 (H) 07/10/2019 1534   GLUCOSE 89 07/10/2019 1534   GLUCOSE 85 08/15/2014 1634   BUN 26 07/10/2019 1534   CREATININE 0.95 07/10/2019 1534   CREATININE 0.88 08/15/2014 1634   CALCIUM 9.4 07/10/2019 1534   PROT 6.5 07/10/2019 1534   ALBUMIN 3.8 07/10/2019 1534   AST 16 07/10/2019 1534   ALT 10 07/10/2019 1534   ALKPHOS 62 07/10/2019 1534   BILITOT <0.2 07/10/2019 1534   GFRNONAA 61 07/10/2019 1534   GFRAA 71 07/10/2019 1534   Lab Results  Component Value Date   CHOL 258 (H) 11/18/2014    HDL 64.10 11/18/2014   LDLCALC 169 (H) 11/18/2014   TRIG 125.0 11/18/2014   CHOLHDL 4 11/18/2014  No results found for: HGBA1C Lab Results  Component Value Date   VITAMINB12 719 10/20/2011   Lab Results  Component Value Date   TSH 0.452 08/15/2014       ASSESSMENT AND PLAN 71 y.o. year old female  has a past medical history of Chicken pox and Migraines. here with     ICD-10-CM   1. Chronic migraine without aura without status migrainosus, not intractable  G43.709         Zaylah is doing fairly well from a migraine perspective.  She is tolerating Emgality and divalproex as prescribed without obvious adverse effects. She will continue current therapy.  She may continue Zomig as needed for abortive therapy.  I will also try to get Nurtec covered as this may work a little quicker for her.  She is aware that she may take 1 tablet daily at onset of migraine. Healthy lifestyle habits encouraged.  She will follow-Chapman with me in 1 year, sooner if needed.  She verbalizes understanding and agreement with this plan.   No orders of the defined types were placed in this encounter.    Meds ordered this encounter  Medications   Rimegepant Sulfate (NURTEC) 75 MG TBDP    Sig: Take 75 mg by mouth daily as needed (take for abortive therapy of migraine, no more than 1 tablet in 24 hours or 10 per month).    Dispense:  8 tablet    Refill:  11    Order Specific Question:   Supervising Provider    Answer:   Melvenia Beam [[id number]]      Izela Altier Ubaldo Glassing, FNP-C 09/14/2021, 2:13 PM [facility] [address], [place], [place] [address] [phone]

## 2021-09-13 NOTE — Patient Instructions (Signed)
Below is our plan:  We will continue Emgality injections every month, divalproex ER 750 daily and zolmitriptan as needed. We will try Nurtec for abortive therapy. I think it will work faster for you. Take 1 tablet daily.   Please make sure you are staying well hydrated. I recommend 50-60 ounces daily. Well balanced diet and regular exercise encouraged. Consistent sleep schedule with 6-8 hours recommended.   Please continue follow up with care team as directed.   Follow up with me in 1 year   You may receive a survey regarding today's visit. I encourage you to leave honest feed back as I do use this information to improve patient care. Thank you for seeing me today!

## 2021-09-14 ENCOUNTER — Ambulatory Visit: Payer: PPO | Admitting: Family Medicine

## 2021-09-14 ENCOUNTER — Encounter: Payer: Self-pay | Admitting: Family Medicine

## 2021-09-14 VITALS — BP 101/69 | HR 101 | Ht 63.0 in | Wt 99.0 lb

## 2021-09-14 DIAGNOSIS — G43709 Chronic migraine without aura, not intractable, without status migrainosus: Secondary | ICD-10-CM | POA: Diagnosis not present

## 2021-09-14 MED ORDER — NURTEC 75 MG PO TBDP: 75.0000 mg | ORAL_TABLET | Freq: Every day | ORAL | 11 refills | Status: DC | PRN

## 2021-10-12 ENCOUNTER — Encounter: Payer: Self-pay | Admitting: Family Medicine

## 2021-10-13 ENCOUNTER — Other Ambulatory Visit: Payer: Self-pay | Admitting: *Deleted

## 2021-10-13 MED ORDER — ZOLMITRIPTAN 5 MG PO TBDP: ORAL_TABLET | ORAL | 11 refills | Status: DC

## 2021-10-13 MED ORDER — DIVALPROEX SODIUM ER 250 MG PO TB24: 750.0000 mg | ORAL_TABLET | Freq: Every day | ORAL | 3 refills | Status: DC

## 2021-11-02 ENCOUNTER — Telehealth: Payer: Self-pay | Admitting: Family Medicine

## 2021-11-02 MED ORDER — ZOLMITRIPTAN 5 MG PO TABS: 5.0000 mg | ORAL_TABLET | ORAL | 5 refills | Status: DC | PRN

## 2021-11-02 NOTE — Telephone Encounter (Signed)
Pt has called to report that re: her zolmitriptan (ZOMIG-ZMT) 5 MG disintegrating tablet she has been told it is no longer covered.  Pt states to save cost she is willing to take the pill that has to be swallowed, please call ?

## 2021-11-02 NOTE — Telephone Encounter (Signed)
Called the pt back and there was no answer. LVM advising I got her message. Called the pt back and advised I would change to the tablet form and send that to the pharmacy for her. Instructed the pt to call back if she has questions.  ?

## 2021-11-09 ENCOUNTER — Other Ambulatory Visit: Payer: Self-pay | Admitting: *Deleted

## 2021-11-09 ENCOUNTER — Telehealth: Payer: Self-pay | Admitting: *Deleted

## 2021-11-09 DIAGNOSIS — G43709 Chronic migraine without aura, not intractable, without status migrainosus: Secondary | ICD-10-CM

## 2021-11-09 MED ORDER — EMGALITY 120 MG/ML ~~LOC~~ SOAJ: 1.0000 mL | SUBCUTANEOUS | 2 refills | Status: DC

## 2021-11-09 NOTE — Telephone Encounter (Signed)
Submitted PA Emgality on CMM. Key: [id number]. PA approved 11/09/21-11/09/22.  ? ?

## 2021-11-18 ENCOUNTER — Telehealth: Payer: Self-pay | Admitting: Family Medicine

## 2021-11-18 DIAGNOSIS — G43709 Chronic migraine without aura, not intractable, without status migrainosus: Secondary | ICD-10-CM

## 2021-11-18 MED ORDER — ZOLMITRIPTAN 5 MG PO TABS: 5.0000 mg | ORAL_TABLET | ORAL | 5 refills | Status: DC | PRN

## 2021-11-18 NOTE — Telephone Encounter (Signed)
Pt claims Pharmacy requested her call [facility] to confirm prescription of zolmitriptan (ZOMIG) 5 MG tablet can be filled.  ?Brenda [phone] ?

## 2021-11-18 NOTE — Telephone Encounter (Signed)
Called pt. Advised [facility] to [facility] 11/02/[facility]. She states she no longer uses this pharmacy. Would like it to go to [facility] instead. Advised I will e-scribe that now for her. She verbalized understanding. ?

## 2021-12-30 DIAGNOSIS — G4701 Insomnia due to medical condition: Secondary | ICD-10-CM | POA: Insufficient documentation

## 2022-03-28 ENCOUNTER — Other Ambulatory Visit: Payer: Self-pay | Admitting: Physician Assistant

## 2022-03-28 DIAGNOSIS — Z1231 Encounter for screening mammogram for malignant neoplasm of breast: Secondary | ICD-10-CM

## 2022-04-14 IMAGING — MG MM DIGITAL SCREENING BILAT W/ TOMO AND CAD
8 series · 9 of 24 positions shown · non-contrast
Comparison: Previous exam(s).

CLINICAL DATA: Screening.

EXAM:
DIGITAL SCREENING BILATERAL MAMMOGRAM WITH TOMOSYNTHESIS AND CAD
TECHNIQUE: Bilateral screening digital craniocaudal and mediolateral oblique
mammograms were obtained. Bilateral screening digital breast
tomosynthesis was performed. The images were evaluated with
computer-aided detection.

[R MLO synth-2D]
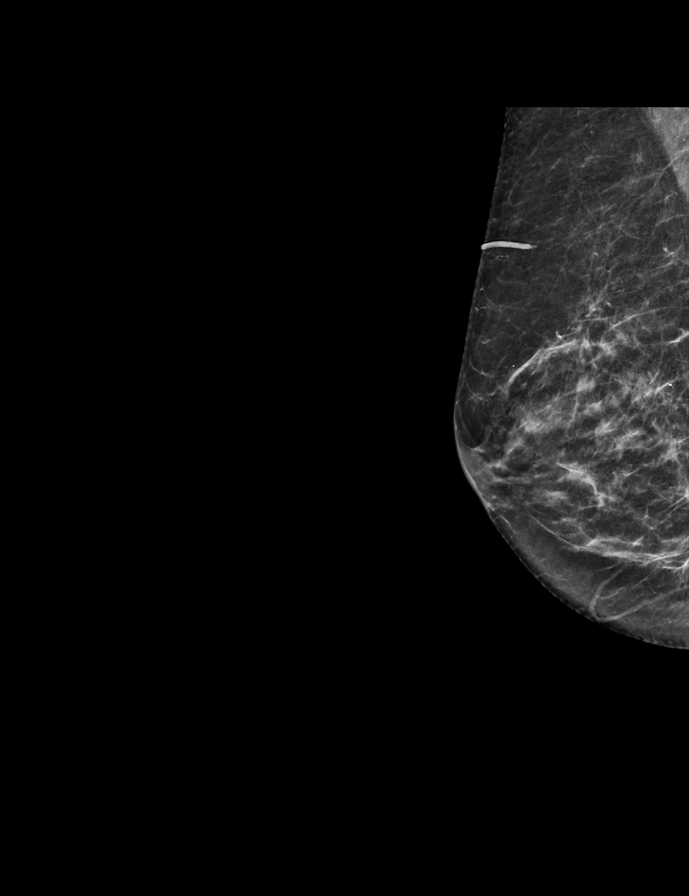

[L CC synth-2D]
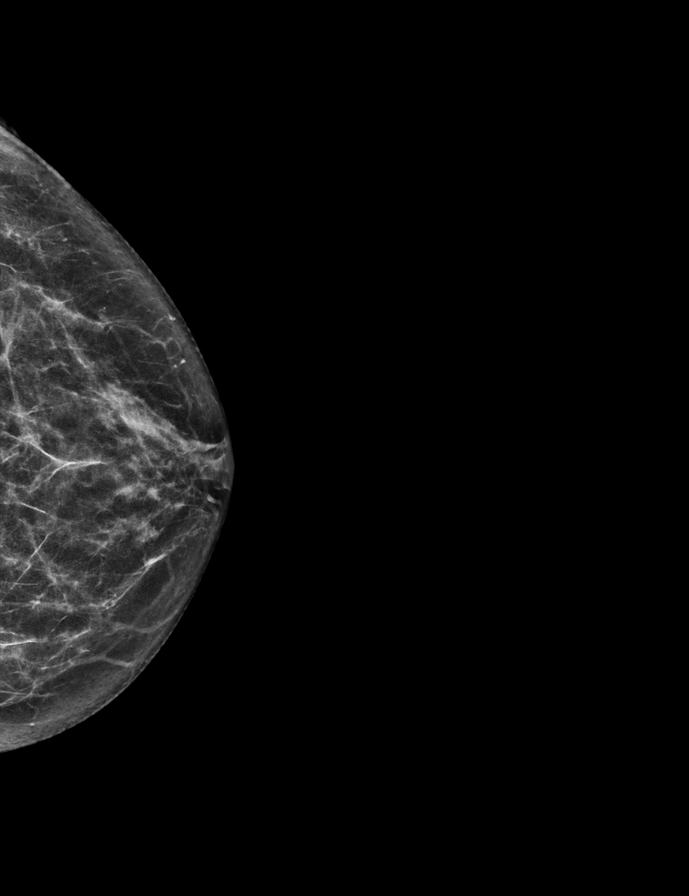

[L MLO synth-2D]
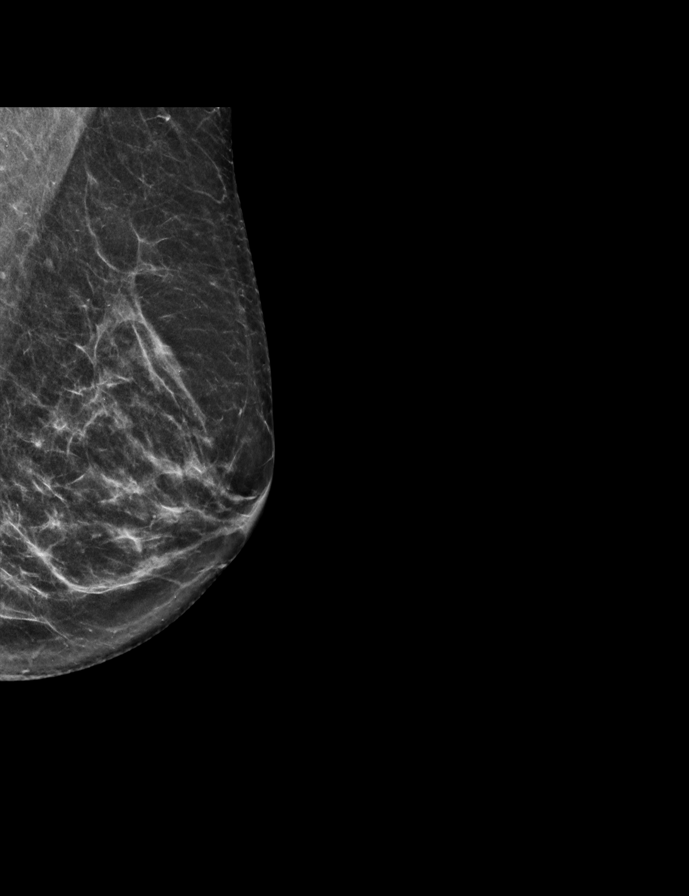

[R CC synth-2D]
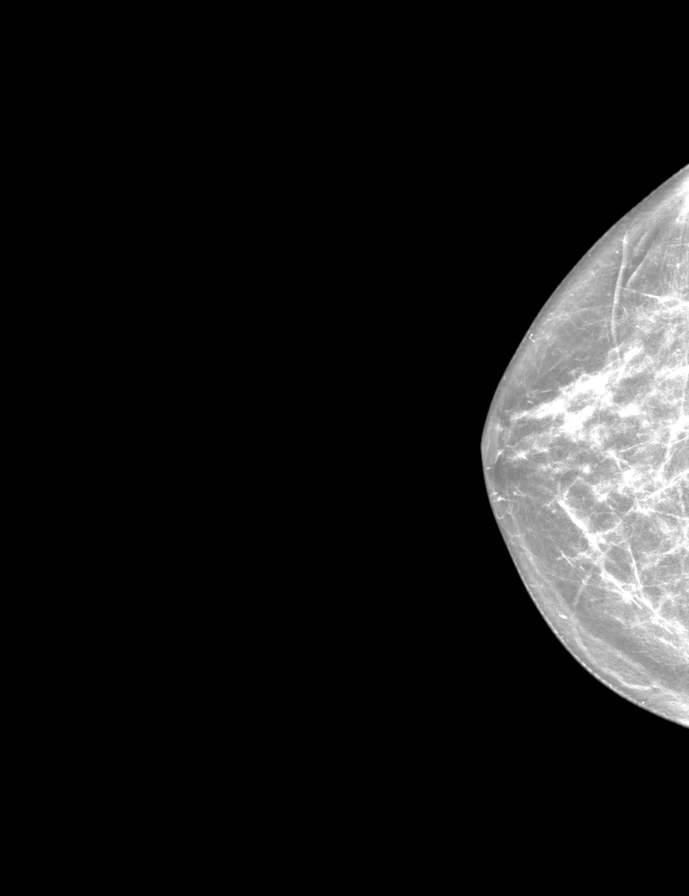

[R MLO tomo · 2 of 56 frames shown]
[frame 19/56]
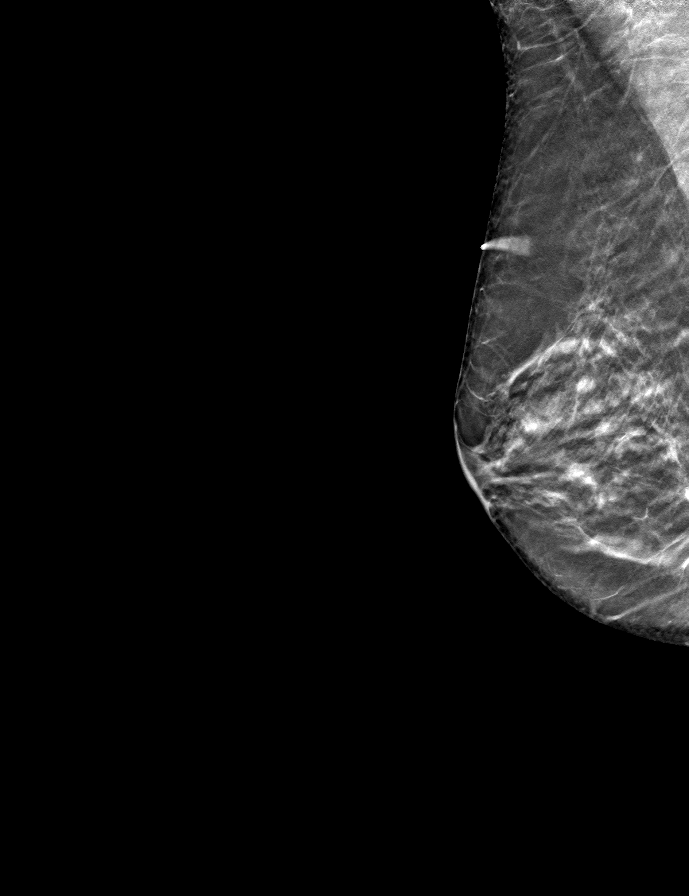
[frame 29/56]
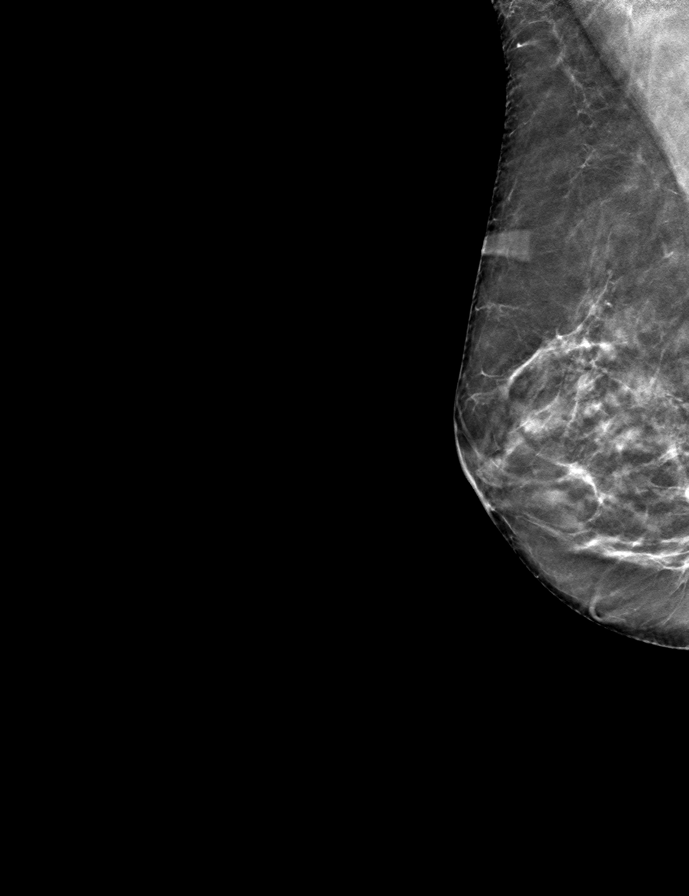

[L CC tomo · tomo slice 23/46.0]
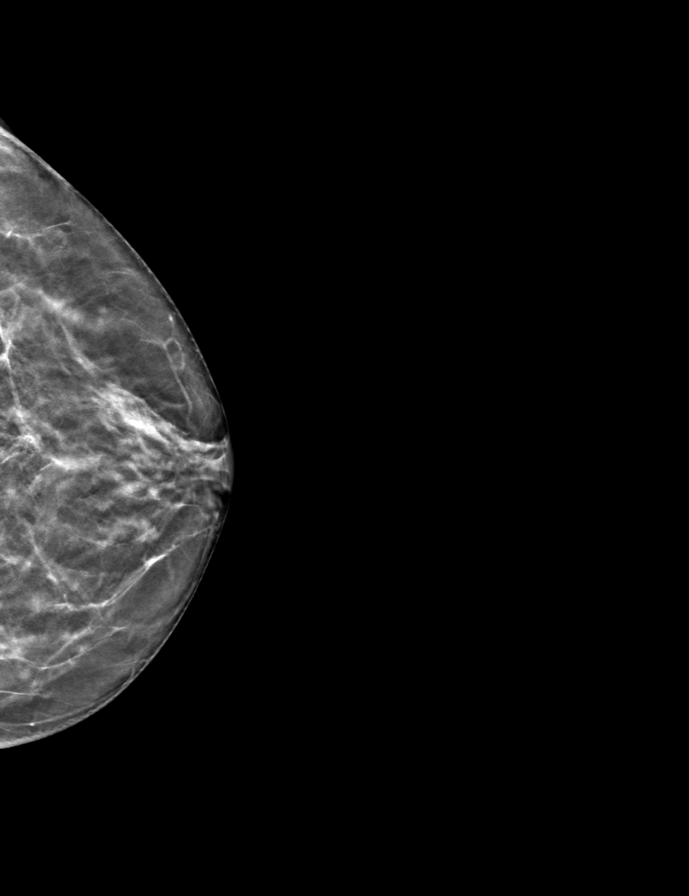

[R CC tomo · tomo slice 31/61.0]
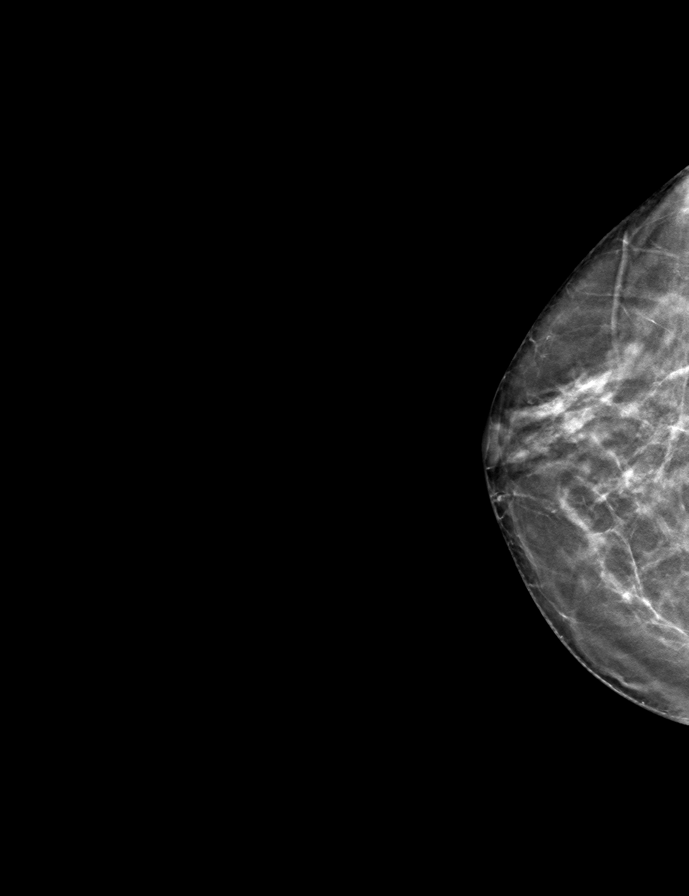

[L MLO tomo · tomo slice 29/58.0]
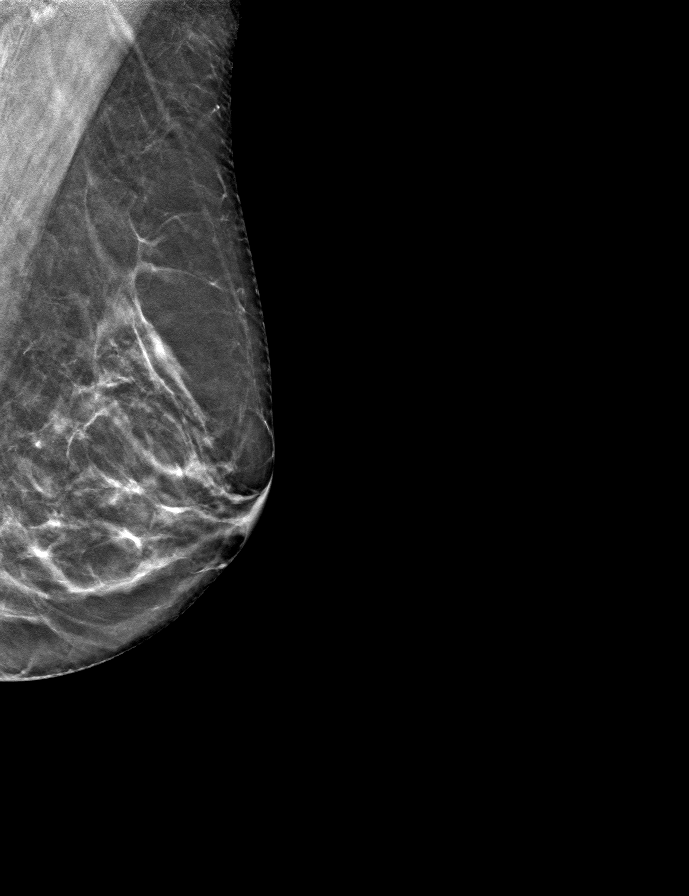

[9 of 24 positions shown; findings below may reference images not displayed]

ACR Breast Density Category c: The breast tissue is heterogeneously
dense, which may obscure small masses.
FINDINGS: There are no findings suspicious for malignancy.
IMPRESSION: No mammographic evidence of malignancy. A result letter of this
screening mammogram will be mailed directly to the patient.

RECOMMENDATION:
Screening mammogram in one year. (Code:Q3-W-BC3)

BI-RADS CATEGORY  1: Negative.

## 2022-04-28 ENCOUNTER — Ambulatory Visit: Payer: PPO

## 2022-04-29 DIAGNOSIS — M545 Low back pain, unspecified: Secondary | ICD-10-CM | POA: Insufficient documentation

## 2022-05-20 ENCOUNTER — Ambulatory Visit
Admission: RE | Admit: 2022-05-20 | Discharge: 2022-05-20 | Disposition: A | Discharge status: Home / Self Care | Payer: PPO | Source: Ambulatory Visit | Attending: Physician Assistant | Admitting: Physician Assistant

## 2022-05-20 DIAGNOSIS — Z1231 Encounter for screening mammogram for malignant neoplasm of breast: Secondary | ICD-10-CM

## 2022-07-06 DIAGNOSIS — N3281 Overactive bladder: Secondary | ICD-10-CM | POA: Insufficient documentation

## 2022-08-10 ENCOUNTER — Other Ambulatory Visit: Payer: Self-pay | Admitting: *Deleted

## 2022-08-10 DIAGNOSIS — G43709 Chronic migraine without aura, not intractable, without status migrainosus: Secondary | ICD-10-CM

## 2022-08-10 MED ORDER — EMGALITY 120 MG/ML ~~LOC~~ SOAJ: 1.0000 mL | SUBCUTANEOUS | 0 refills | Status: DC

## 2022-08-10 NOTE — Telephone Encounter (Signed)
Follow up visit scheduled on 09/14/22

## 2022-09-13 NOTE — Progress Notes (Unsigned)
PATIENT: Patricia Chapman DOB: 10/05/1950  REASON FOR VISIT: follow up HISTORY FROM: patient  No chief complaint on file.   HISTORY OF PRESENT ILLNESS:  09/13/22 ALL: [name] returns for follow up for migraines. She was last seen 09/2021 and migraines were stable. We continued Emgality, divalproex ER '750mg'$  daily and zolmitriptan as needed. I also called in Nurtec for abortive therapy to use for intractable headaches. Since,   09/14/2021 ALL: [name] returns for migraine follow up. She continues Emgality, divalproex ER '750mg'$  daily and zolmitriptan as needed. [name] was not effective. She reports she is having 8-10 headache days a month with about 6 migrainous days. Zolmitriptan helps. She reports that it usually a couple of hours to work. She does not usually have to take another dose. She wishes zolmitriptan would work quick. She is tried Imitrex oral and injections, Maxalt, Zomig and Ubrelvy.   08/05/2020 ALL:  Presents today for follow-up on migraines.  She continues Emgality, divalproex ER 750 mg daily and Zomig for abortive therapy.  She reports that migraines are fairly stable, overall.  She may have 5-7 migrainous days each month.  She does feel that there may have been just a slight increase in the number of headaches she has since last being seen.  She does not feel that Zomig works as fast as she would like for it to.  Eventually it does seem to help somewhat.  She has tried sumatriptan injections in the past that were fairly effective but too expensive.  She feels that memory is fairly stable.  She is able to maintain her home and finances.  She continues to work.  She is driving without difficulty.  History (copied from previous note)   [name] is a 72 y.o. female here today for follow up for migraines. She continues Emgality monthly, divalproex ER '750mg'$  daily and Zembrace or Zomig for abortive therapy. She has tried to wean divalproex but reports a significant worsening of  migraines with lower doses. She denies obvious adverse effects with medications. On average she may have 3-4 migraines one month and none the next. Overall she feels that she is doing very well. She has CPE scheduled with PCP this month. She does continue to note short term memory loss. She describes concerns of forgetting if she has cleaned something, hard time recalling names at times, forgetting why she walked into a room. She is able to perform all ADL's. She is driving without difficulty. She is able to dose her medications appropriately and is very clear of what she is taking. No family history of neurodegenerative disease.   HISTORY: (copied from Dr Cathren Laine note on 07/19/2018)  Interval history 07/19/2018: doing incredibly well. Only 2 migraines in 4 weeks. She does great. The migraines are shorter only last 1-2 days (used to last up to 5 days). She tried to go off of depakote and worsened now back on and better. She takes zomig and naproxen.  Will try Zembrace. She doesn't sleep well. She gets up to urinate. She can;t go back to sleep. She has trouble initiating sleep. She denies racing thoughts. She goes to be 11-12 and sleeps until 9-10. No symptoms Discussed good sleep habits, discussed sleep counseling. Suggested going to be at midnight and waking up at 8, she sleeps until 10.    HPI:  Patricia Chapman is a 72 y.o. female here as a referral from Dr. Hoy Morn for migraines. She was a patient of dr. Earley Favor and then  Dr. Melton Alar.  She has had headaches since her early 20s, typical migraines are lateralized severe pounding headaches with associated light noise sensitivity, no nausea, lasting 4 to 72 hours.  Previous triggers were wind perfume bright light stress not sleeping well and weather changes.  She has had 3 shots of Emgality. She has only had 4 headache days last month. In January had 15.  We discussed migraines and migraine management, at this point she appears to be doing well on and reality.  Her  migraine frequency has been reduced.  The for headache days last month were migrainous however they were less severe no known inciting events or triggers, they resolve with acute management.   Reviewed notes, labs and imaging from outside physicians, which showed:   Reviewed prior notes.  Patient had headaches for almost 50 years.  She had seen multiple neurologist in the pack.  Typical migraines are unilateral severe pulsating with associated photophobia and phonophobia but without nausea or vomiting.  Over the year she suffered with severe frequent migraine headaches.  Currently on Depakote.  Last visit with Dr. Krista Blue prior to starting a modality she reported 8 migraine days in a month minimum taking Zomig as needed with breakthrough headaches.  Dr. Krista Blue reviewed notes from Dr. Bobby Rumpf.  She is tried and failed multiple preventative medications in the past including:   Gabapentin, Inderal, imipramine, Topamax (developed rash), amitriptyline, nortriptyline, Effexor, verapamil, protriptyline, Zonegran, Keppra, memantine which caused mood difficulty, could not tolerate magnesium.  Also had no significant benefit with Botox injections x2.  She also had neurostimulator unit, acupuncture, riboflavin, Q enzyme every 10 which have not been helpful.  For abortive treatment she is tried Imitrex, Maxalt, Zomig.   TSH nml in 2016   REVIEW OF SYSTEMS: Out of a complete 14 system review of symptoms, the patient complains only of the following symptoms, headaches, memory loss and all other reviewed systems are negative.   ALLERGIES: Allergies  Allergen Reactions   Penicillins     Whelps as a young adult    HOME MEDICATIONS: Outpatient Medications Prior to Visit  Medication Sig Dispense Refill   CALCIUM PO Take 600 mg by mouth daily.     divalproex (DEPAKOTE ER) 250 MG 24 hr tablet Take 3 tablets (750 mg total) by mouth daily. 270 tablet 3   Galcanezumab-gnlm (EMGALITY) 120 MG/ML SOAJ Inject 1 mL into the  skin every 30 (thirty) days. 3 mL 0   Rimegepant Sulfate (NURTEC) 75 MG TBDP Take 75 mg by mouth daily as needed (take for abortive therapy of migraine, no more than 1 tablet in 24 hours or 10 per month). 8 tablet 11   Vibegron (GEMTESA) 75 MG TABS      zolmitriptan (ZOMIG) 5 MG tablet Take 1 tablet (5 mg total) by mouth as needed for migraine (Take one tablet at onset of migraine. May repeat 1 tablet in 2 hours but do not exceed two doses in 24 hours). 10 tablet 5   No facility-administered medications prior to visit.    PAST MEDICAL HISTORY: Past Medical History:  Diagnosis Date   Chicken pox    Migraines     PAST SURGICAL HISTORY: Past Surgical History:  Procedure Laterality Date   BREAST BIOPSY     BREAST EXCISIONAL BIOPSY Right 1991   benign   FINGER SURGERY Left     FAMILY HISTORY: Family History  Problem Relation Age of Onset   Hyperlipidemia Mother    Stroke Mother  Hypertension Mother    Congestive Heart Failure Mother    Heart disease Maternal Grandmother    Kidney disease Maternal Grandfather    Stroke Father     SOCIAL HISTORY: Social History   Socioeconomic History   Marital status: Married    Spouse name: Not on file   Number of children: 2   Years of education: 16   Highest education level: Bachelor's degree (e.g., BA, AB, BS)  Occupational History   Occupation: Part-time for her self-owned print shop  Tobacco Use   Smoking status: Never   Smokeless tobacco: Never  Vaping Use   Vaping Use: Never used  Substance and Sexual Activity   Alcohol use: No   Drug use: No   Sexual activity: Never  Other Topics Concern   Not on file  Social History Narrative   Lives at home with her husband.   Right-handed.   No caffeine use.   She is going to be a grandmother December 2019    Social Determinants of Radio broadcast assistant Strain: Not on [employer] Insecurity: Not on file  Transportation Needs: Not on file  Physical Activity: Not on file   Stress: Not on file  Social Connections: Not on file  Intimate Partner Violence: Not on file      PHYSICAL EXAM  There were no vitals filed for this visit.   There is no height or weight on file to calculate BMI.  Generalized: Well developed, in no acute distress  Cardiology: normal rate and rhythm, no murmur noted Neurological examination  Mentation: Alert oriented to time, place, history taking. Follows all commands speech and language fluent Cranial nerve II-XII: Pupils were equal round reactive to light. Extraocular movements were full, visual field were full on confrontational test. Facial sensation and strength were normal. Uvula tongue midline. Head turning and shoulder shrug  were normal and symmetric. Motor: The motor testing reveals 5 over 5 strength of all 4 extremities. Good symmetric motor tone is noted throughout.   Gait and station: Gait is normal.     DIAGNOSTIC DATA (LABS, IMAGING, TESTING) - I reviewed patient records, labs, notes, testing and imaging myself where available.      No data to display           Lab Results  Component Value Date   WBC 6.4 08/15/2014   HGB 13.8 08/15/2014   HCT 41.9 08/15/2014   MCV 93.3 08/15/2014   PLT 293 08/15/2014      Component Value Date/Time   NA 143 07/10/2019 1534   K 4.5 07/10/2019 1534   CL 102 07/10/2019 1534   CO2 30 (H) 07/10/2019 1534   GLUCOSE 89 07/10/2019 1534   GLUCOSE 85 08/15/2014 1634   BUN 26 07/10/2019 1534   CREATININE 0.95 07/10/2019 1534   CREATININE 0.88 08/15/2014 1634   CALCIUM 9.4 07/10/2019 1534   PROT 6.5 07/10/2019 1534   ALBUMIN 3.8 07/10/2019 1534   AST 16 07/10/2019 1534   ALT 10 07/10/2019 1534   ALKPHOS 62 07/10/2019 1534   BILITOT <0.2 07/10/2019 1534   GFRNONAA 61 07/10/2019 1534   GFRAA 71 07/10/2019 1534   Lab Results  Component Value Date   CHOL 258 (H) 11/18/2014   HDL 64.10 11/18/2014   LDLCALC 169 (H) 11/18/2014   TRIG 125.0 11/18/2014   CHOLHDL 4  11/18/2014   No results found for: "HGBA1C" Lab Results  Component Value Date   VITAMINB12 719 10/20/2011   Lab  Results  Component Value Date   TSH 0.452 08/15/2014       ASSESSMENT AND PLAN 72 y.o. year old female  has a past medical history of Chicken pox and Migraines. here with   No diagnosis found.   Gwenn is doing fairly well from a migraine perspective.  She is tolerating Emgality and divalproex as prescribed without obvious adverse effects. She will continue current therapy.  She may continue Zomig as needed for abortive therapy.  I will also try to get Nurtec covered as this may work a little quicker for her.  She is aware that she may take 1 tablet daily at onset of migraine. Healthy lifestyle habits encouraged.  She will follow-up with me in 1 year, sooner if needed.  She verbalizes understanding and agreement with this plan.   No orders of the defined types were placed in this encounter.    No orders of the defined types were placed in this encounter.     Debbora Presto, FNP-C 09/13/2022, 12:51 PM [facility] [address], [place], [place] [address] [phone]

## 2022-09-13 NOTE — Patient Instructions (Signed)
Below is our plan:  We will continue Emgality monthly and divalproex ER 750mg  daily and zolmitriptan as needed. We will try naratriptan for intractable headaches. Please take 1 tablet at onset of headache. May take 1 additional tablet in 2 hours if needed. Do not take more than 2 tablets in 24 hours or more than 10 in a month.   Please make sure you are staying well hydrated. I recommend 50-60 ounces daily. Well balanced diet and regular exercise encouraged. Consistent sleep schedule with 6-8 hours recommended.   Please continue follow up with care team as directed.   Follow up with me in 1 year   You may receive a survey regarding today's visit. I encourage you to leave honest feed back as I do use this information to improve patient care. Thank you for seeing me today!

## 2022-09-14 ENCOUNTER — Ambulatory Visit (INDEPENDENT_AMBULATORY_CARE_PROVIDER_SITE_OTHER): Payer: PPO | Admitting: Family Medicine

## 2022-09-14 ENCOUNTER — Encounter: Payer: Self-pay | Admitting: Family Medicine

## 2022-09-14 DIAGNOSIS — G43709 Chronic migraine without aura, not intractable, without status migrainosus: Secondary | ICD-10-CM | POA: Diagnosis not present

## 2022-09-14 MED ORDER — EMGALITY 120 MG/ML ~~LOC~~ SOAJ: 1.0000 mL | SUBCUTANEOUS | 3 refills | Status: DC

## 2022-09-14 MED ORDER — DIVALPROEX SODIUM ER 250 MG PO TB24: 750.0000 mg | ORAL_TABLET | Freq: Every day | ORAL | 3 refills | Status: DC

## 2022-09-14 MED ORDER — ZOLMITRIPTAN 5 MG PO TABS: 5.0000 mg | ORAL_TABLET | ORAL | 11 refills | Status: DC | PRN

## 2022-09-14 MED ORDER — NARATRIPTAN HCL 2.5 MG PO TABS: 2.5000 mg | ORAL_TABLET | ORAL | 11 refills | Status: DC | PRN

## 2022-11-14 ENCOUNTER — Other Ambulatory Visit: Payer: Self-pay | Admitting: *Deleted

## 2022-11-14 DIAGNOSIS — G43709 Chronic migraine without aura, not intractable, without status migrainosus: Secondary | ICD-10-CM

## 2022-11-14 NOTE — Telephone Encounter (Signed)
Last seen on 09/14/22 Follow up scheduled on 09/20/23  Last filled on 11/12/22 #1 pen ( 30 day supply)

## 2023-04-13 ENCOUNTER — Other Ambulatory Visit: Payer: Self-pay | Admitting: Physician Assistant

## 2023-04-13 DIAGNOSIS — Z1231 Encounter for screening mammogram for malignant neoplasm of breast: Secondary | ICD-10-CM

## 2023-06-07 ENCOUNTER — Ambulatory Visit: Payer: PPO

## 2023-07-03 ENCOUNTER — Ambulatory Visit
Admission: RE | Admit: 2023-07-03 | Discharge: 2023-07-03 | Disposition: A | Discharge status: Home / Self Care | Payer: PPO | Source: Ambulatory Visit | Attending: Physician Assistant | Admitting: Physician Assistant

## 2023-07-03 DIAGNOSIS — Z1231 Encounter for screening mammogram for malignant neoplasm of breast: Secondary | ICD-10-CM

## 2023-09-20 ENCOUNTER — Ambulatory Visit: Payer: PPO | Admitting: Family Medicine

## 2023-09-26 ENCOUNTER — Ambulatory Visit: Payer: PPO | Admitting: Family Medicine

## 2023-09-26 ENCOUNTER — Encounter: Payer: Self-pay | Admitting: Family Medicine

## 2023-09-26 DIAGNOSIS — G43709 Chronic migraine without aura, not intractable, without status migrainosus: Secondary | ICD-10-CM | POA: Diagnosis not present

## 2023-09-26 MED ORDER — DIVALPROEX SODIUM ER 250 MG PO TB24: 750.0000 mg | ORAL_TABLET | Freq: Every day | ORAL | 3 refills | Status: DC

## 2023-09-26 MED ORDER — NARATRIPTAN HCL 2.5 MG PO TABS: 2.5000 mg | ORAL_TABLET | ORAL | 11 refills | Status: AC | PRN

## 2023-09-26 MED ORDER — AJOVY 225 MG/1.5ML ~~LOC~~ SOAJ: 225.0000 mg | SUBCUTANEOUS | 3 refills | Status: DC

## 2023-09-26 NOTE — Patient Instructions (Addendum)
 Below is our plan:  We will continue divalproex and naratriptan as prescribed. Stop Emgality once you receive Ajovy. Continue Ajovy injections once monthly   Please make sure you are staying well hydrated. I recommend 50-60 ounces daily. Well balanced diet and regular exercise encouraged. Consistent sleep schedule with 6-8 hours recommended.   Please continue follow up with care team as directed.   Follow up with me in 6 months   You may receive a survey regarding today's visit. I encourage you to leave honest feed back as I do use this information to improve patient care. Thank you for seeing me today!   GENERAL HEADACHE INFORMATION:   Natural supplements: Magnesium Oxide or Magnesium Glycinate 500 mg at bed (up to 800 mg daily) Coenzyme Q10 300 mg in AM Vitamin B2- 200 mg twice a day   Add 1 supplement at a time since even natural supplements can have undesirable side effects. You can sometimes buy supplements cheaper (especially Coenzyme Q10) at www.WebmailGuide.co.za or at [facility].  Migraine with aura: There is increased risk for stroke in women with migraine with aura and a contraindication for the combined contraceptive pill for use by women who have migraine with aura. The risk for women with migraine without aura is lower. However other risk factors like smoking are far more likely to increase stroke risk than migraine. There is a recommendation for no smoking and for the use of OCPs without estrogen such as progestogen only pills particularly for women with migraine with aura.Marland Kitchen People who have migraine headaches with auras may be 3 times more likely to have a stroke caused by a blood clot, compared to migraine patients who don't see auras. Women who take hormone-replacement therapy may be 30 percent more likely to suffer a clot-based stroke than women not taking medication containing estrogen. Other risk factors like smoking and high blood pressure may be  much more important.    Vitamins and  herbs that show potential:   Magnesium: Magnesium (250 mg twice a day or 500 mg at bed) has a relaxant effect on smooth muscles such as blood vessels. Individuals suffering from frequent or daily headache usually have low magnesium levels which can be increase with daily supplementation of 400-750 mg. Three trials found 40-90% average headache reduction  when used as a preventative. Magnesium may help with headaches are aura, the best evidence for magnesium is for migraine with aura is its thought to stop the cortical spreading depression we believe is the pathophysiology of migraine aura.Magnesium also demonstrated the benefit in menstrually related migraine.  Magnesium is part of the messenger system in the serotonin cascade and it is a good muscle relaxant.  It is also useful for constipation which can be a side effect of other medications used to treat migraine. Good sources include nuts, whole grains, and tomatoes. Side Effects: loose stool/diarrhea  Riboflavin (vitamin B 2) 200 mg twice a day. This vitamin assists nerve cells in the production of ATP a principal energy storing molecule.  It is necessary for many chemical reactions in the body.  There have been at least 3 clinical trials of riboflavin using 400 mg per day all of which suggested that migraine frequency can be decreased.  All 3 trials showed significant improvement in over half of migraine sufferers.  The supplement is found in bread, cereal, milk, meat, and poultry.  Most Americans get more riboflavin than the recommended daily allowance, however riboflavin deficiency is not necessary for the supplements to help  prevent headache. Side effects: energizing, green urine   Coenzyme Q10: This is present in almost all cells in the body and is critical component for the conversion of energy.  Recent studies have shown that a nutritional supplement of CoQ10 can reduce the frequency of migraine attacks by improving the energy production of cells as  with riboflavin.  Doses of 150 mg twice a day have been shown to be effective.   Melatonin: Increasing evidence shows correlation between melatonin secretion and headache conditions.  Melatonin supplementation has decreased headache intensity and duration.  It is widely used as a sleep aid.  Sleep is natures way of dealing with migraine.  A dose of 3 mg is recommended to start for headaches including cluster headache. Higher doses up to 15 mg has been reviewed for use in Cluster headache and have been used. The rationale behind using melatonin for cluster is that many theories regarding the cause of Cluster headache center around the disruption of the normal circadian rhythm in the brain.  This helps restore the normal circadian rhythm.   HEADACHE DIET: Foods and beverages which may trigger migraine Note that only 20% of headache patients are food sensitive. You will know if you are food sensitive if you get a headache consistently 20 minutes to 2 hours after eating a certain food. Only cut out a food if it causes headaches, otherwise you might remove foods you enjoy! What matters most for diet is to eat a well balanced healthy diet full of vegetables and low fat protein, and to not miss meals.   Chocolate, other sweets ALL cheeses except cottage and cream cheese Dairy products, yogurt, sour cream, ice cream Liver Meat extracts (Bovril, Marmite, meat tenderizers) Meats or fish which have undergone aging, fermenting, pickling or smoking. These include: Hotdogs,salami,Lox,sausage, mortadellas,smoked salmon, pepperoni, Pickled herring Pods of broad bean (English beans, Chinese pea pods, [place] (fava) beans, lima and navy beans Ripe avocado, ripe banana Yeast extracts or active yeast preparations such as Brewer's or Fleishman's (commercial bakes goods are permitted) Tomato based foods, pizza (lasagna, etc.)   MSG (monosodium glutamate) is disguised as many things; look for these common  aliases: Monopotassium glutamate Autolysed yeast Hydrolysed protein Sodium caseinate "flavorings" "all natural preservatives" Nutrasweet   Avoid all other foods that convincingly provoke headaches.   Resources: The Dizzy [place]air Laundry Your Headache Diet, migrainestrong.com  https://zamora-andrews.com/   Caffeine and Migraine For patients that have migraine, caffeine intake more than 3 days per week can lead to dependency and increased migraine frequency. I would recommend cutting back on your caffeine intake as best you can. The recommended amount of caffeine is 200-300 mg daily, although migraine patients may experience dependency at even lower doses. While you may notice an increase in headache temporarily, cutting back will be helpful for headaches in the long run. For more information on caffeine and migraine, visit: https://americanmigrainefoundation.org/resource-library/caffeine-and-migraine/   Headache Prevention Strategies:   1. Maintain a headache diary; learn to identify and avoid triggers.  - This can be a simple note where you log when you had a headache, associated symptoms, and medications used - There are several smartphone apps developed to help track migraines: Migraine Buddy, Migraine Monitor, Curelator N1-Headache App   Common triggers include: Emotional triggers: Emotional/Upset family or friends Emotional/Upset occupation Business reversal/success Anticipation anxiety Crisis-serious Post-crisis periodNew job/position   Physical triggers: Vacation Day Weekend Strenuous Exercise High Altitude Location New Move Menstrual Day Physical Illness Oversleep/Not enough sleep Weather changes Light: Photophobia or light  sesnitivity treatment involves a balance between desensitization and reduction in overly strong input. Use dark polarized glasses outside, but not inside. Avoid bright or fluorescent light, but do not dim  environment to the point that going into a normally lit room hurts. Consider FL-41 tint lenses, which reduce the most irritating wavelengths without blocking too much light.  These can be obtained at axonoptics.com or theraspecs.com Foods: see list above.   2. Limit use of acute treatments (over-the-counter medications, triptans, etc.) to no more than 2 days per week or 10 days per month to prevent medication overuse headache (rebound headache).     3. Follow a regular schedule (including weekends and holidays): Don't skip meals. Eat a balanced diet. 8 hours of sleep nightly. Minimize stress. Exercise 30 minutes per day. Being overweight is associated with a 5 times increased risk of chronic migraine. Keep well hydrated and drink 6-8 glasses of water per day.   4. Initiate non-pharmacologic measures at the earliest onset of your headache. Rest and quiet environment. Relax and reduce stress. Breathe2Relax is a free app that can instruct you on    some simple relaxtion and breathing techniques. Http://Dawnbuse.com is a    free website that provides teaching videos on relaxation.  Also, there are  many apps that   can be downloaded for "mindful" relaxation.  An app called YOGA NIDRA will help walk you through mindfulness. Another app called Calm can be downloaded to give you a structured mindfulness guide with daily reminders and skill development. Headspace for guided meditation Mindfulness Based Stress Reduction Online Course: www.palousemindfulness.com Cold compresses.   5. Don't wait!! Take the maximum allowable dosage of prescribed medication at the first sign of migraine.   6. Compliance:  Take prescribed medication regularly as directed and at the first sign of a migraine.   7. Communicate:  Call your physician when problems arise, especially if your headaches change, increase in frequency/severity, or become associated with neurological symptoms (weakness, numbness, slurred speech, etc.).  Proceed to emergency room if you experience new or worsening symptoms or symptoms do not resolve, if you have new neurologic symptoms or if headache is severe, or for any concerning symptom.   8. Headache/pain management therapies: Consider various complementary methods, including medication, behavioral therapy, psychological counselling, biofeedback, massage therapy, acupuncture, dry needling, and other modalities.  Such measures may reduce the need for medications. Counseling for pain management, where patients learn to function and ignore/minimize their pain, seems to work very well.   9. Recommend changing family's attention and focus away from patient's headaches. Instead, emphasize daily activities. If first question of day is 'How are your headaches/Do you have a headache today?', then patient will constantly think about headaches, thus making them worse. Goal is to re-direct attention away from headaches, toward daily activities and other distractions.   10. Helpful Websites: www.AmericanHeadacheSociety.org PatentHood.ch www.headaches.org TightMarket.nl www.achenet.org

## 2023-09-26 NOTE — Progress Notes (Signed)
 PATIENT: Patricia Chapman DOB: 03-10-1951  REASON FOR VISIT: follow up HISTORY FROM: patient  Chief Complaint  Patient presents with   Follow-up    Pt in 2 alone Pt here for Migraine f/u Pt states 6 migraines in last month     HISTORY OF PRESENT ILLNESS:  09/26/23 ALL: Patricia Chapman returns for follow up for migraines. She was last seen 09/2022 and doing well. We continued Emgality, divalproex and zolmitriptan. Added naratriptan for longer acting abortive. Since, she reports migraines are fairly stable. She continues to have 8-10 headache days with 4-5 being migrainous. She stopped zolmitriptan as insurance would not voer. Naratriptan has worked well for her.   09/14/2022 ALL:  Patricia Chapman returns for follow up for migraines. She was last seen 09/2021 and migraines were stable. We continued Emgality, divalproex ER 750mg  daily and zolmitriptan as needed. I also called in Nurtec for abortive therapy to use for intractable headaches. Since, she reports migraines are unchanged. Still 8-10 headache days with 5-6 migraines. Nurtec was not helpful. She has had an intractable migraine for the past 7 days. Zolmitriptan helps but headache returns.   Medications tried and failed: Emgality, divalproex, Gabapentin, Inderal, imipramine, Topamax (developed rash), amitriptyline, nortriptyline, Effexor, verapamil, protriptyline, Zonegran, Keppra, memantine which caused mood difficulty, could not tolerate magnesium.  Also had no significant benefit with Botox injections x2.  She also had neurostimulator unit, acupuncture, riboflavin, Q enzyme every 10 which have not been helpful.  For abortive treatment she is tried Imitrex, Maxalt, Zomig.  09/14/2021 ALL: Patricia Chapman returns for migraine follow up. She continues Emgality, divalproex ER 750mg  daily and zolmitriptan as needed. [name] was not effective. She reports she is having 8-10 headache days a month with about 6 migrainous days. Zolmitriptan helps. She reports that it usually a  couple of hours to work. She does not usually have to take another dose. She wishes zolmitriptan would work quick. She is tried Imitrex oral and injections, Maxalt, Zomig and Ubrelvy.   08/05/2020 ALL:  Presents today for follow-up on migraines.  She continues Emgality, divalproex ER 750 mg daily and Zomig for abortive therapy.  She reports that migraines are fairly stable, overall.  She may have 5-7 migrainous days each month.  She does feel that there may have been just a slight increase in the number of headaches she has since last being seen.  She does not feel that Zomig works as fast as she would like for it to.  Eventually it does seem to help somewhat.  She has tried sumatriptan injections in the past that were fairly effective but too expensive.  She feels that memory is fairly stable.  She is able to maintain her home and finances.  She continues to work.  She is driving without difficulty.  History (copied from previous note)   Patricia Chapman is a 73 y.o. female here today for follow up for migraines. She continues Emgality monthly, divalproex ER 750mg  daily and Zembrace or Zomig for abortive therapy. She has tried to wean divalproex but reports a significant worsening of migraines with lower doses. She denies obvious adverse effects with medications. On average she may have 3-4 migraines one month and none the next. Overall she feels that she is doing very well. She has CPE scheduled with PCP this month. She does continue to note short term memory loss. She describes concerns of forgetting if she has cleaned something, hard time recalling names at times, forgetting why she walked into a room.  She is able to perform all ADL's. She is driving without difficulty. She is able to dose her medications appropriately and is very clear of what she is taking. No family history of neurodegenerative disease.   HISTORY: (copied from Dr Trevor Mace note on 07/19/2018)  Interval history 07/19/2018: doing  incredibly well. Only 2 migraines in 4 weeks. She does great. The migraines are shorter only last 1-2 days (used to last up to 5 days). She tried to go off of depakote and worsened now back on and better. She takes zomig and naproxen.  Will try Zembrace. She doesn't sleep well. She gets up to urinate. She can;t go back to sleep. She has trouble initiating sleep. She denies racing thoughts. She goes to be 11-12 and sleeps until 9-10. No symptoms Discussed good sleep habits, discussed sleep counseling. Suggested going to be at midnight and waking up at 8, she sleeps until 10.    HPI:  Patricia Chapman is a 73 y.o. female here as a referral from Dr. Joellyn Quails for migraines. She was a patient of dr. Meryl Crutch and then Dr. Vela Prose.  She has had headaches since her early 46s, typical migraines are lateralized severe pounding headaches with associated light noise sensitivity, no nausea, lasting 4 to 72 hours.  Previous triggers were wind perfume bright light stress not sleeping well and weather changes.  She has had 3 shots of Emgality. She has only had 4 headache days last month. In January had 15.  We discussed migraines and migraine management, at this point she appears to be doing well on and reality.  Her migraine frequency has been reduced.  The for headache days last month were migrainous however they were less severe no known inciting events or triggers, they resolve with acute management.   Reviewed notes, labs and imaging from outside physicians, which showed:   Reviewed prior notes.  Patient had headaches for almost 50 years.  She had seen multiple neurologist in the pack.  Typical migraines are unilateral severe pulsating with associated photophobia and phonophobia but without nausea or vomiting.  Over the year she suffered with severe frequent migraine headaches.  Currently on Depakote.  Last visit with Dr. Terrace Arabia prior to starting a modality she reported 8 migraine days in a month minimum taking Zomig as needed  with breakthrough headaches.  Dr. Terrace Arabia reviewed notes from Dr. Melvyn Neth.  She is tried and failed multiple preventative medications in the past including:   Gabapentin, Inderal, imipramine, Topamax (developed rash), amitriptyline, nortriptyline, Effexor, verapamil, protriptyline, Zonegran, Keppra, memantine which caused mood difficulty, could not tolerate magnesium.  Also had no significant benefit with Botox injections x2.  She also had neurostimulator unit, acupuncture, riboflavin, Q enzyme every 10 which have not been helpful.  For abortive treatment she is tried Imitrex, Maxalt, Zomig.   TSH nml in 2016   REVIEW OF SYSTEMS: Out of a complete 14 system review of symptoms, the patient complains only of the following symptoms, headaches, memory loss and all other reviewed systems are negative.   ALLERGIES: Allergies  Allergen Reactions   Penicillins     Whelps as a young adult    HOME MEDICATIONS: Outpatient Medications Prior to Visit  Medication Sig Dispense Refill   CALCIUM PO Take 600 mg by mouth daily.     divalproex (DEPAKOTE ER) 250 MG 24 hr tablet Take 3 tablets (750 mg total) by mouth daily. 270 tablet 3   Galcanezumab-gnlm (EMGALITY) 120 MG/ML SOAJ Inject 1 mL into the skin  every 30 (thirty) days. 3 mL 3   naratriptan (AMERGE) 2.5 MG tablet Take 1 tablet (2.5 mg total) by mouth as needed for migraine. Take one (1) tablet at onset of headache; if returns or does not resolve, may repeat after 4 hours; do not exceed five (5) mg in 24 hours. 10 tablet 11   zolmitriptan (ZOMIG) 5 MG tablet Take 1 tablet (5 mg total) by mouth as needed for migraine (Take one tablet at onset of migraine. May repeat 1 tablet in 2 hours but do not exceed two doses in 24 hours). 10 tablet 11   No facility-administered medications prior to visit.    PAST MEDICAL HISTORY: Past Medical History:  Diagnosis Date   Chicken pox    Migraines     PAST SURGICAL HISTORY: Past Surgical History:  Procedure  Laterality Date   BREAST BIOPSY     BREAST EXCISIONAL BIOPSY Right 1991   benign   FINGER SURGERY Left     FAMILY HISTORY: Family History  Problem Relation Age of Onset   Hyperlipidemia Mother    Stroke Mother    Hypertension Mother    Congestive Heart Failure Mother    Stroke Father    Migraines Father    Breast cancer Sister 87 - 48   Heart disease Maternal Grandmother    Kidney disease Maternal Grandfather    Migraines Daughter    BRCA 1/2 Neg Hx     SOCIAL HISTORY: Social History   Socioeconomic History   Marital status: Married    Spouse name: Not on file   Number of children: 2   Years of education: 16   Highest education level: Bachelor's degree (e.g., BA, AB, BS)  Occupational History   Occupation: Part-time for her self-owned print shop  Tobacco Use   Smoking status: Never   Smokeless tobacco: Never  Vaping Use   Vaping status: Never Used  Substance and Sexual Activity   Alcohol use: No   Drug use: No   Sexual activity: Never  Other Topics Concern   Not on file  Social History Narrative   Lives at home with her husband.   Right-handed.   No caffeine use.   She is going to be a grandmother December 2019    Pt lives with husband    Pt works    Social Drivers of Corporate investment banker Strain: Low Risk  (12/27/2021)   Received from [facility]   Overall Financial Resource Strain (CARDIA)  Food Insecurity: No Food Insecurity (04/28/2022)   Received from [facility]   Hunger Vital Sign  Transportation Needs: No Transportation Needs (04/28/2022)   Received from [facility]   PRAPARE - Transportation  Physical Activity: Insufficiently Active (12/27/2021)   Received from [facility]   Exercise Vital Sign  Stress: Stress Concern Present (12/27/2021)   Received from [facility]   [employer] of Occupational Health - Occupational Stress Questionnaire  Social Connections: Socially Integrated (12/27/2021)   Received from [facility]    Social Connection and Isolation Panel [NHANES]  Intimate Partner Violence: Not At Risk (12/27/2021)   Received from [facility] visits prior to 10/08/2022., [facility] visits prior to 10/08/2022.   Humiliation, Afraid, Rape, and Kick questionnaire    Fear of Current or Ex-Partner: No    Emotionally Abused: No    Physically Abused: No    Sexually Abused: No      PHYSICAL EXAM  Vitals:  09/26/23 1330  BP: 96/63  Pulse: 82  Weight: 103 lb (46.7 kg)  Height: 5\' 3"  (1.6 m)      Body mass index is 18.25 kg/m.  Generalized: Well developed, in no acute distress  Cardiology: normal rate and rhythm, no murmur noted Neurological examination  Mentation: Alert oriented to time, place, history taking. Follows all commands speech and language fluent Cranial nerve II-XII: Pupils were equal round reactive to light. Extraocular movements were full, visual field were full on confrontational test. Facial sensation and strength were normal. Uvula tongue midline. Head turning and shoulder shrug  were normal and symmetric. Motor: The motor testing reveals 5 over 5 strength of all 4 extremities. Good symmetric motor tone is noted throughout.   Gait and station: Gait is normal.     DIAGNOSTIC DATA (LABS, IMAGING, TESTING) - I reviewed patient records, labs, notes, testing and imaging myself where available.      No data to display           Lab Results  Component Value Date   WBC 6.4 08/15/2014   HGB 13.8 08/15/2014   HCT 41.9 08/15/2014   MCV 93.3 08/15/2014   PLT 293 08/15/2014      Component Value Date/Time   NA 143 07/10/2019 1534   K 4.5 07/10/2019 1534   CL 102 07/10/2019 1534   CO2 30 (H) 07/10/2019 1534   GLUCOSE 89 07/10/2019 1534   GLUCOSE 85 08/15/2014 1634   BUN 26 07/10/2019 1534   CREATININE 0.95 07/10/2019 1534   CREATININE 0.88 08/15/2014 1634   CALCIUM 9.4 07/10/2019 1534   PROT 6.5 07/10/2019 1534   ALBUMIN 3.8  07/10/2019 1534   AST 16 07/10/2019 1534   ALT 10 07/10/2019 1534   ALKPHOS 62 07/10/2019 1534   BILITOT <0.2 07/10/2019 1534   GFRNONAA 61 07/10/2019 1534   GFRAA 71 07/10/2019 1534   Lab Results  Component Value Date   CHOL 258 (H) 11/18/2014   HDL 64.10 11/18/2014   LDLCALC 169 (H) 11/18/2014   TRIG 125.0 11/18/2014   CHOLHDL 4 11/18/2014   No results found for: "HGBA1C" Lab Results  Component Value Date   VITAMINB12 719 10/20/2011   Lab Results  Component Value Date   TSH 0.452 08/15/2014       ASSESSMENT AND PLAN 73 y.o. year old female  has a past medical history of Chicken pox and Migraines. here with     ICD-10-CM   1. Chronic migraine without aura without status migrainosus, not intractable  G43.709 Valproic Acid Level      Breezie is doing fairly well from a migraine perspective.  She is tolerating Emgality and divalproex as prescribed without obvious adverse effects but concerned Emgality may not work as well. We will continue divalproex 750mg  daily and switch Emgality to Ajovy. Continue naratriptan for abortive therapy. She is aware that she may take 1 tablet at onset of migraine, may repeat in 1 hour but not to exceed two total doses of either in 24 hours or 10 per month. Healthy lifestyle habits encouraged.  She will follow-up with me in 6 months, sooner if needed.  She verbalizes understanding and agreement with this plan.   Orders Placed This Encounter  Procedures   Valproic Acid Level     Meds ordered this encounter  Medications   divalproex (DEPAKOTE ER) 250 MG 24 hr tablet    Sig: Take 3 tablets (750 mg total) by mouth daily.    Dispense:  270 tablet    Refill:  3    Supervising Provider:   Anson Fret [[id number]]   naratriptan (AMERGE) 2.5 MG tablet    Sig: Take 1 tablet (2.5 mg total) by mouth as needed for migraine. Take one (1) tablet at onset of headache; if returns or does not resolve, may repeat after 4 hours; do not exceed five (5) mg in  24 hours.    Dispense:  10 tablet    Refill:  11    Supervising Provider:   Anson Fret [[id number]]   Fremanezumab-vfrm (AJOVY) 225 MG/1.5ML SOAJ    Sig: Inject 225 mg into the skin every 30 (thirty) days.    Dispense:  4.5 mL    Refill:  3    Supervising Provider:   Anson Fret [[id number]]      WNU UVOZD, FNP-C 09/26/2023, 4:22 PM [facility] [address], [address] [place], [place] [address] [phone]

## 2023-09-27 ENCOUNTER — Encounter: Payer: Self-pay | Admitting: Family Medicine

## 2023-09-27 LAB — VALPROIC ACID LEVEL: Valproic Acid Lvl: 65 ug/mL (ref 50–100)

## 2023-10-09 ENCOUNTER — Other Ambulatory Visit: Payer: Self-pay | Admitting: *Deleted

## 2023-10-09 MED ORDER — DIVALPROEX SODIUM ER 250 MG PO TB24: 750.0000 mg | ORAL_TABLET | Freq: Every day | ORAL | 1 refills | Status: AC

## 2023-11-13 ENCOUNTER — Encounter: Payer: Self-pay | Admitting: Family Medicine

## 2023-11-14 MED ORDER — EMGALITY 120 MG/ML ~~LOC~~ SOAJ: 120.0000 mg | SUBCUTANEOUS | 5 refills | Status: DC

## 2023-11-14 NOTE — Addendum Note (Signed)
 Addended by: Raynald Kemp A on: 11/14/2023 07:39 AM   Modules accepted: Orders

## 2024-01-09 DIAGNOSIS — R7989 Other specified abnormal findings of blood chemistry: Secondary | ICD-10-CM | POA: Insufficient documentation

## 2024-01-09 DIAGNOSIS — R3129 Other microscopic hematuria: Secondary | ICD-10-CM | POA: Insufficient documentation

## 2024-01-09 DIAGNOSIS — R7303 Prediabetes: Secondary | ICD-10-CM | POA: Insufficient documentation

## 2024-03-22 ENCOUNTER — Telehealth: Payer: Self-pay | Admitting: Family Medicine

## 2024-03-22 NOTE — Telephone Encounter (Signed)
 Request to r/s appointment

## 2024-04-01 ENCOUNTER — Ambulatory Visit: Payer: PPO | Admitting: Family Medicine

## 2024-05-13 ENCOUNTER — Other Ambulatory Visit: Payer: Self-pay | Admitting: *Deleted

## 2024-05-13 MED ORDER — EMGALITY 120 MG/ML ~~LOC~~ SOAJ: 120.0000 mg | SUBCUTANEOUS | 9 refills | Status: AC

## 2024-05-13 NOTE — Telephone Encounter (Signed)
 Last seen on 09/26/23 Follow up scheduled on 01/13/25

## 2024-06-21 ENCOUNTER — Other Ambulatory Visit: Payer: Self-pay | Admitting: Obstetrics and Gynecology

## 2024-06-21 DIAGNOSIS — Z1231 Encounter for screening mammogram for malignant neoplasm of breast: Secondary | ICD-10-CM

## 2024-06-24 DIAGNOSIS — G43909 Migraine, unspecified, not intractable, without status migrainosus: Secondary | ICD-10-CM | POA: Insufficient documentation

## 2024-07-17 ENCOUNTER — Ambulatory Visit
Admission: RE | Admit: 2024-07-17 | Discharge: 2024-07-17 | Disposition: A | Discharge status: Home / Self Care | Source: Ambulatory Visit | Attending: Obstetrics and Gynecology | Admitting: Obstetrics and Gynecology

## 2024-07-17 DIAGNOSIS — Z1231 Encounter for screening mammogram for malignant neoplasm of breast: Secondary | ICD-10-CM

## 2024-08-15 ENCOUNTER — Ambulatory Visit: Admitting: Family Medicine

## 2024-08-15 DIAGNOSIS — D225 Melanocytic nevi of trunk: Secondary | ICD-10-CM | POA: Insufficient documentation

## 2024-08-15 DIAGNOSIS — D2271 Melanocytic nevi of right lower limb, including hip: Secondary | ICD-10-CM | POA: Insufficient documentation

## 2024-08-15 DIAGNOSIS — D2370 Other benign neoplasm of skin of unspecified lower limb, including hip: Secondary | ICD-10-CM | POA: Insufficient documentation

## 2024-08-15 DIAGNOSIS — Z86018 Personal history of other benign neoplasm: Secondary | ICD-10-CM | POA: Insufficient documentation

## 2024-08-15 DIAGNOSIS — L680 Hirsutism: Secondary | ICD-10-CM | POA: Insufficient documentation

## 2024-08-15 DIAGNOSIS — D1801 Hemangioma of skin and subcutaneous tissue: Secondary | ICD-10-CM | POA: Insufficient documentation

## 2024-08-15 DIAGNOSIS — L570 Actinic keratosis: Secondary | ICD-10-CM | POA: Insufficient documentation

## 2024-08-15 NOTE — Progress Notes (Unsigned)
"       ° °  LILLETTE Ileana Collet, PhD, LAT, ATC acting as a scribe for Artist Lloyd, MD.  Patricia Chapman is a 74 y.o. female who presents to [facility] today for osteoporosis management.  DEXA scan (date, T-score): 09/19/23: Spine= -2.9, L-FN= -2.4, R-FN= -2.5 Prior treatment: *** History of Hip, Spine, or Wrist Fx: *** Heart disease or stroke: *** Cancer: *** Kidney Disease: *** Gastric/Peptic Ulcer: *** Gastric bypass surgery: *** Severe GERD: *** Hx of seizures: *** Age at Menopause: *** Calcium  intake: *** Vitamin D  intake: *** Hormone replacement therapy: *** Smoking history: *** Alcohol: *** Exercise: *** Major dental work in past year: *** Parents with hip/spine fracture: *** Height loss: ***   Pertinent review of systems: ***  Relevant historical information: ***   Exam:  There were no vitals taken for this visit. General: Well Developed, well nourished, and in no acute distress.   MSK: ***    Lab and Radiology Results No results found for this or any previous visit (from the past 72 hours). No results found.     Assessment and Plan: 74 y.o. female with ***   PDMP not reviewed this encounter. No orders of the defined types were placed in this encounter.  No orders of the defined types were placed in this encounter.    Discussed warning signs or symptoms. Please see discharge instructions. Patient expresses understanding.   ***  "

## 2024-09-18 ENCOUNTER — Ambulatory Visit: Admitting: Family Medicine

## 2024-10-10 ENCOUNTER — Ambulatory Visit: Admitting: Family Medicine

## 2025-01-13 ENCOUNTER — Ambulatory Visit: Admitting: Family Medicine
